# Patient Record
Sex: Male | Born: 1950 | Race: White | Hispanic: No | Marital: Married | Smoking: Never smoker
Health system: Southern US, Community
[De-identification: ages and names within clinical notes are randomized; demographics above are authoritative.]

## PROBLEM LIST (undated history)

## (undated) DIAGNOSIS — C801 Malignant (primary) neoplasm, unspecified: Secondary | ICD-10-CM

## (undated) DIAGNOSIS — K635 Polyp of colon: Secondary | ICD-10-CM

## (undated) DIAGNOSIS — R55 Syncope and collapse: Secondary | ICD-10-CM

## (undated) DIAGNOSIS — H269 Unspecified cataract: Secondary | ICD-10-CM

## (undated) DIAGNOSIS — E785 Hyperlipidemia, unspecified: Secondary | ICD-10-CM

## (undated) DIAGNOSIS — I10 Essential (primary) hypertension: Secondary | ICD-10-CM

## (undated) DIAGNOSIS — N400 Enlarged prostate without lower urinary tract symptoms: Secondary | ICD-10-CM

## (undated) HISTORY — DX: Essential (primary) hypertension: I10

## (undated) HISTORY — DX: Malignant (primary) neoplasm, unspecified: C80.1

## (undated) HISTORY — DX: Syncope and collapse: R55

## (undated) HISTORY — DX: Benign prostatic hyperplasia without lower urinary tract symptoms: N40.0

## (undated) HISTORY — DX: Gilbert syndrome: E80.4

## (undated) HISTORY — PX: VASECTOMY: SHX75

## (undated) HISTORY — DX: Hyperlipidemia, unspecified: E78.5

## (undated) HISTORY — DX: Unspecified cataract: H26.9

## (undated) HISTORY — DX: Polyp of colon: K63.5

---

## 2001-11-21 ENCOUNTER — Ambulatory Visit (HOSPITAL_COMMUNITY): Admission: RE | Admit: 2001-11-21 | Discharge: 2001-11-21 | Payer: Self-pay | Admitting: Gastroenterology

## 2001-11-21 ENCOUNTER — Encounter (INDEPENDENT_AMBULATORY_CARE_PROVIDER_SITE_OTHER): Payer: Self-pay | Admitting: Specialist

## 2005-01-24 ENCOUNTER — Ambulatory Visit: Payer: Self-pay | Admitting: Internal Medicine

## 2006-01-22 HISTORY — PX: COLONOSCOPY W/ POLYPECTOMY: SHX1380

## 2006-03-29 ENCOUNTER — Ambulatory Visit: Payer: Self-pay | Admitting: Internal Medicine

## 2006-03-29 LAB — CONVERTED CEMR LAB
ALT: 18 units/L (ref 0–40)
AST: 24 units/L (ref 0–37)
Albumin: 3.9 g/dL (ref 3.5–5.2)
Alkaline Phosphatase: 35 units/L — ABNORMAL LOW (ref 39–117)
BUN: 10 mg/dL (ref 6–23)
Basophils Absolute: 0.1 10*3/uL (ref 0.0–0.1)
Basophils Relative: 1.4 % — ABNORMAL HIGH (ref 0.0–1.0)
Bilirubin, Direct: 0.2 mg/dL (ref 0.0–0.3)
CO2: 29 meq/L (ref 19–32)
Calcium: 9.5 mg/dL (ref 8.4–10.5)
Chloride: 105 meq/L (ref 96–112)
Cholesterol: 176 mg/dL (ref 0–200)
Creatinine, Ser: 1 mg/dL (ref 0.4–1.5)
Eosinophils Absolute: 0.1 10*3/uL (ref 0.0–0.6)
Eosinophils Relative: 3.3 % (ref 0.0–5.0)
GFR calc Af Amer: 100 mL/min
GFR calc non Af Amer: 83 mL/min
Glucose, Bld: 80 mg/dL (ref 70–99)
HCT: 41.3 % (ref 39.0–52.0)
HDL: 61.2 mg/dL (ref >39.0)
Hemoglobin: 14.5 g/dL (ref 13.0–17.0)
LDL Cholesterol: 105 mg/dL — ABNORMAL HIGH (ref 0–99)
Lymphocytes Relative: 29.1 % (ref 12.0–46.0)
MCHC: 35.2 g/dL (ref 30.0–36.0)
MCV: 89.4 fL (ref 78.0–100.0)
Monocytes Absolute: 0.2 10*3/uL (ref 0.2–0.7)
Monocytes Relative: 5.1 % (ref 3.0–11.0)
Neutro Abs: 2.8 10*3/uL (ref 1.4–7.7)
Neutrophils Relative %: 61.1 % (ref 43.0–77.0)
PSA: 0.78 ng/mL (ref 0.10–4.00)
Platelets: 230 10*3/uL (ref 150–400)
Potassium: 4.1 meq/L (ref 3.5–5.1)
RBC: 4.62 M/uL (ref 4.22–5.81)
RDW: 12.2 % (ref 11.5–14.6)
Sodium: 141 meq/L (ref 135–145)
TSH: 2.16 microintl units/mL (ref 0.35–5.50)
Total Bilirubin: 1 mg/dL (ref 0.3–1.2)
Total CHOL/HDL Ratio: 2.9
Total Protein: 6.4 g/dL (ref 6.0–8.3)
Triglycerides: 50 mg/dL (ref 0–149)
VLDL: 10 mg/dL (ref 0–40)
WBC: 4.5 10*3/uL (ref 4.5–10.5)

## 2006-04-25 ENCOUNTER — Ambulatory Visit: Payer: Self-pay | Admitting: Internal Medicine

## 2006-05-09 ENCOUNTER — Encounter (INDEPENDENT_AMBULATORY_CARE_PROVIDER_SITE_OTHER): Payer: Self-pay | Admitting: Specialist

## 2006-05-09 ENCOUNTER — Ambulatory Visit: Payer: Self-pay | Admitting: Internal Medicine

## 2007-01-28 ENCOUNTER — Ambulatory Visit: Payer: Self-pay | Admitting: Internal Medicine

## 2007-05-09 ENCOUNTER — Ambulatory Visit: Payer: Self-pay | Admitting: Internal Medicine

## 2007-05-20 ENCOUNTER — Telehealth (INDEPENDENT_AMBULATORY_CARE_PROVIDER_SITE_OTHER): Payer: Self-pay | Admitting: *Deleted

## 2008-10-26 ENCOUNTER — Ambulatory Visit: Payer: Self-pay | Admitting: Internal Medicine

## 2008-10-26 DIAGNOSIS — Z860100 Personal history of colon polyps, unspecified: Secondary | ICD-10-CM | POA: Insufficient documentation

## 2008-10-26 DIAGNOSIS — H9319 Tinnitus, unspecified ear: Secondary | ICD-10-CM | POA: Insufficient documentation

## 2008-10-26 DIAGNOSIS — G252 Other specified forms of tremor: Secondary | ICD-10-CM | POA: Insufficient documentation

## 2008-10-27 ENCOUNTER — Encounter (INDEPENDENT_AMBULATORY_CARE_PROVIDER_SITE_OTHER): Payer: Self-pay | Admitting: *Deleted

## 2008-10-29 ENCOUNTER — Encounter (INDEPENDENT_AMBULATORY_CARE_PROVIDER_SITE_OTHER): Payer: Self-pay | Admitting: *Deleted

## 2008-11-29 ENCOUNTER — Ambulatory Visit: Payer: Self-pay | Admitting: Internal Medicine

## 2008-12-27 ENCOUNTER — Telehealth (INDEPENDENT_AMBULATORY_CARE_PROVIDER_SITE_OTHER): Payer: Self-pay | Admitting: *Deleted

## 2009-03-14 ENCOUNTER — Ambulatory Visit: Payer: Self-pay | Admitting: Internal Medicine

## 2009-06-13 ENCOUNTER — Ambulatory Visit: Payer: Self-pay | Admitting: Internal Medicine

## 2009-06-13 DIAGNOSIS — M2550 Pain in unspecified joint: Secondary | ICD-10-CM | POA: Insufficient documentation

## 2009-06-13 DIAGNOSIS — Z91013 Allergy to seafood: Secondary | ICD-10-CM | POA: Insufficient documentation

## 2009-06-14 ENCOUNTER — Telehealth (INDEPENDENT_AMBULATORY_CARE_PROVIDER_SITE_OTHER): Payer: Self-pay | Admitting: *Deleted

## 2009-06-14 LAB — CONVERTED CEMR LAB
Basophils Relative: 0.7 % (ref 0.0–3.0)
Eosinophils Relative: 2.2 % (ref 0.0–5.0)
Lymphs Abs: 1 10*3/uL (ref 0.7–4.0)
MCHC: 34.7 g/dL (ref 30.0–36.0)
Monocytes Absolute: 0.7 10*3/uL (ref 0.1–1.0)
Monocytes Relative: 16 % — ABNORMAL HIGH (ref 3.0–12.0)
Neutro Abs: 2.3 10*3/uL (ref 1.4–7.7)

## 2009-06-15 ENCOUNTER — Encounter: Payer: Self-pay | Admitting: Internal Medicine

## 2009-07-07 ENCOUNTER — Ambulatory Visit: Payer: Self-pay | Admitting: Internal Medicine

## 2009-07-15 LAB — CONVERTED CEMR LAB: IgE (Immunoglobulin E), Serum: 322.9 intl units/mL — ABNORMAL HIGH (ref 0.0–180.0)

## 2009-08-08 ENCOUNTER — Ambulatory Visit: Payer: Self-pay | Admitting: Internal Medicine

## 2009-12-07 ENCOUNTER — Telehealth: Payer: Self-pay | Admitting: Internal Medicine

## 2010-01-31 ENCOUNTER — Other Ambulatory Visit: Payer: Self-pay | Admitting: Internal Medicine

## 2010-01-31 ENCOUNTER — Encounter: Payer: Self-pay | Admitting: Internal Medicine

## 2010-01-31 ENCOUNTER — Ambulatory Visit
Admission: RE | Admit: 2010-01-31 | Discharge: 2010-01-31 | Payer: Self-pay | Source: Home / Self Care | Attending: Internal Medicine | Admitting: Internal Medicine

## 2010-01-31 DIAGNOSIS — I10 Essential (primary) hypertension: Secondary | ICD-10-CM | POA: Insufficient documentation

## 2010-01-31 DIAGNOSIS — E785 Hyperlipidemia, unspecified: Secondary | ICD-10-CM | POA: Insufficient documentation

## 2010-01-31 DIAGNOSIS — Z85828 Personal history of other malignant neoplasm of skin: Secondary | ICD-10-CM | POA: Insufficient documentation

## 2010-01-31 LAB — BASIC METABOLIC PANEL
BUN: 11 mg/dL (ref 6–23)
CO2: 30 mEq/L (ref 19–32)
Calcium: 10 mg/dL (ref 8.4–10.5)
Chloride: 102 mEq/L (ref 96–112)
Creatinine, Ser: 0.9 mg/dL (ref 0.4–1.5)
GFR: 96.94 mL/min (ref >60.00)
Glucose, Bld: 76 mg/dL (ref 70–99)
Potassium: 4.4 mEq/L (ref 3.5–5.1)
Sodium: 140 mEq/L (ref 135–145)

## 2010-01-31 LAB — CBC WITH DIFFERENTIAL/PLATELET
Basophils Absolute: 0 10*3/uL (ref 0.0–0.1)
Basophils Relative: 0.6 % (ref 0.0–3.0)
Eosinophils Absolute: 0.2 10*3/uL (ref 0.0–0.7)
Eosinophils Relative: 3.1 % (ref 0.0–5.0)
HCT: 46.3 % (ref 39.0–52.0)
Hemoglobin: 16.4 g/dL (ref 13.0–17.0)
Lymphocytes Relative: 26.3 % (ref 12.0–46.0)
Lymphs Abs: 1.4 10*3/uL (ref 0.7–4.0)
MCHC: 35.3 g/dL (ref 30.0–36.0)
MCV: 93.8 fl (ref 78.0–100.0)
Monocytes Absolute: 0.5 10*3/uL (ref 0.1–1.0)
Monocytes Relative: 9.9 % (ref 3.0–12.0)
Neutro Abs: 3.2 10*3/uL (ref 1.4–7.7)
Neutrophils Relative %: 60.1 % (ref 43.0–77.0)
Platelets: 252 10*3/uL (ref 150.0–400.0)
RBC: 4.94 Mil/uL (ref 4.22–5.81)
RDW: 12.6 % (ref 11.5–14.6)
WBC: 5.3 10*3/uL (ref 4.5–10.5)

## 2010-01-31 LAB — CONVERTED CEMR LAB: HDL goal, serum: 40 mg/dL

## 2010-01-31 LAB — PSA: PSA: 0.81 ng/mL (ref 0.10–4.00)

## 2010-01-31 LAB — HEPATIC FUNCTION PANEL
ALT: 82 U/L — ABNORMAL HIGH (ref 0–53)
AST: 54 U/L — ABNORMAL HIGH (ref 0–37)
Albumin: 4.5 g/dL (ref 3.5–5.2)
Alkaline Phosphatase: 86 U/L (ref 39–117)
Bilirubin, Direct: 0.2 mg/dL (ref 0.0–0.3)
Total Bilirubin: 1 mg/dL (ref 0.3–1.2)
Total Protein: 7.9 g/dL (ref 6.0–8.3)

## 2010-01-31 LAB — TSH: TSH: 1.88 u[IU]/mL (ref 0.35–5.50)

## 2010-01-31 LAB — LIPID PANEL
Cholesterol: 226 mg/dL — ABNORMAL HIGH (ref 0–200)
HDL: 48.9 mg/dL (ref >39.00)
Total CHOL/HDL Ratio: 5
Triglycerides: 88 mg/dL (ref 0.0–149.0)
VLDL: 17.6 mg/dL (ref 0.0–40.0)

## 2010-01-31 LAB — LDL CHOLESTEROL, DIRECT: Direct LDL: 165.8 mg/dL

## 2010-02-16 ENCOUNTER — Encounter: Payer: Self-pay | Admitting: Internal Medicine

## 2010-02-16 ENCOUNTER — Ambulatory Visit
Admission: RE | Admit: 2010-02-16 | Discharge: 2010-02-16 | Payer: Self-pay | Source: Home / Self Care | Attending: Internal Medicine | Admitting: Internal Medicine

## 2010-02-16 ENCOUNTER — Other Ambulatory Visit: Payer: Self-pay | Admitting: Internal Medicine

## 2010-02-16 DIAGNOSIS — G479 Sleep disorder, unspecified: Secondary | ICD-10-CM | POA: Insufficient documentation

## 2010-02-16 DIAGNOSIS — R413 Other amnesia: Secondary | ICD-10-CM | POA: Insufficient documentation

## 2010-02-16 LAB — VITAMIN B12: Vitamin B-12: 323 pg/mL (ref 211–911)

## 2010-02-17 ENCOUNTER — Encounter: Payer: Self-pay | Admitting: Internal Medicine

## 2010-02-17 LAB — CONVERTED CEMR LAB

## 2010-02-19 LAB — CONVERTED CEMR LAB
ALT: 24 units/L (ref 0–53)
AST: 27 units/L (ref 0–37)
Albumin: 4.4 g/dL (ref 3.5–5.2)
Alkaline Phosphatase: 44 units/L (ref 39–117)
BUN: 9 mg/dL (ref 6–23)
Basophils Absolute: 0 10*3/uL (ref 0.0–0.1)
Basophils Relative: 1 % (ref 0.0–3.0)
Bilirubin, Direct: 0.2 mg/dL (ref 0.0–0.3)
CO2: 29 meq/L (ref 19–32)
Calcium: 9.5 mg/dL (ref 8.4–10.5)
Chloride: 105 meq/L (ref 96–112)
Cholesterol: 208 mg/dL — ABNORMAL HIGH (ref 0–200)
Creatinine, Ser: 0.9 mg/dL (ref 0.4–1.5)
Direct LDL: 118.8 mg/dL
Eosinophils Absolute: 0.1 10*3/uL (ref 0.0–0.7)
Eosinophils Relative: 2.5 % (ref 0.0–5.0)
GFR calc non Af Amer: 92.4 mL/min (ref >60)
Glucose, Bld: 101 mg/dL — ABNORMAL HIGH (ref 70–99)
HCT: 45.2 % (ref 39.0–52.0)
HDL: 72.1 mg/dL (ref >39.00)
Hemoglobin: 15.8 g/dL (ref 13.0–17.0)
Hgb A1c MFr Bld: 5.5 % (ref 4.6–6.5)
Lymphocytes Relative: 29.2 % (ref 12.0–46.0)
Lymphs Abs: 1.1 10*3/uL (ref 0.7–4.0)
MCHC: 34.9 g/dL (ref 30.0–36.0)
MCV: 92.8 fL (ref 78.0–100.0)
Monocytes Absolute: 0.3 10*3/uL (ref 0.1–1.0)
Monocytes Relative: 9.1 % (ref 3.0–12.0)
Neutro Abs: 2.2 10*3/uL (ref 1.4–7.7)
Neutrophils Relative %: 58.2 % (ref 43.0–77.0)
PSA: 1.52 ng/mL (ref 0.10–4.00)
Platelets: 211 10*3/uL (ref 150.0–400.0)
Potassium: 4.6 meq/L (ref 3.5–5.1)
RBC: 4.86 M/uL (ref 4.22–5.81)
RDW: 12.1 % (ref 11.5–14.6)
Sodium: 140 meq/L (ref 135–145)
TSH: 2.32 microintl units/mL (ref 0.35–5.50)
Total Bilirubin: 1.2 mg/dL (ref 0.3–1.2)
Total CHOL/HDL Ratio: 3
Total Protein: 7.7 g/dL (ref 6.0–8.3)
Triglycerides: 52 mg/dL (ref 0.0–149.0)
VLDL: 10.4 mg/dL (ref 0.0–40.0)
WBC: 3.7 10*3/uL — ABNORMAL LOW (ref 4.5–10.5)

## 2010-02-21 NOTE — Progress Notes (Signed)
Summary: Prior Auth APPROVED SINGULAIR  Phone Note Refill Request Call back at [phone] Message from:  Pharmacy on Jun 14, 2009 9:07 AM  Refills Requested: Medication #1:  SINGULAIR 10MG  TABLETS, take 1 tablet by mouth daily as needed   Dosage confirmed as above?Dosage Confirmed   Supply Requested: 1 month   Last Refilled: 06/13/2009 Prior Auth [phone] Id#: [id number]  Next Appointment Scheduled: none Initial call taken by: Harold Barban,  Jun 14, 2009 9:08 AM  Follow-up for Phone Call        prior auth APPROVED 06-14-09-03-09-12, PHARMACY FAXED, APPROVAL LETTER SCAN TO CHART.................Marland KitchenFelecia Deloach CMA  Jun 15, 2009 10:07 AM

## 2010-02-21 NOTE — Progress Notes (Signed)
Summary: med refill  Phone Note Refill Request Call back at [phone] Message from:  Patient on December 07, 2009 1:28 PM  Refills Requested: Medication #1:  PROPRANOLOL HCL 20 MG TABS 1 by mouth two times a day if B/P averages 135/85. Patient is already aware he has cpx coming up.  Initial call taken by: Lucious Groves CMA,  December 07, 2009 1:28 PM    Prescriptions: PROPRANOLOL HCL 20 MG TABS (PROPRANOLOL HCL) 1 by mouth two times a day if B/P averages 135/85  #60 x 3   Entered by:   Lucious Groves CMA   Authorized by:   Marga Melnick MD   Signed by:   Lucious Groves CMA on 12/07/2009   Method used:   Electronically to        [id number]* (retail)       [address]       [place], [place]  [address]       Ph: [phone]       Fax: [phone]   RxID:   [id number]

## 2010-02-21 NOTE — Assessment & Plan Note (Signed)
Summary: Flu?/kdc   Vital Signs:  Patient profile:   60 year old male Weight:      187.0 pounds Temp:     98.6 degrees F oral Pulse rate:   64 / minute Resp:     14 per minute BP sitting:   122 / 80  (left arm) Cuff size:   large  Vitals Entered By: Shonna Chock (March 14, 2009 4:47 PM) CC: Head/Chest congestion, swollen glands on left side,cold sore, chills (off/on), & cough x 8-9 days. Patient also with stomach discomfort and nausea (only yesterday) Comments REVIEWED MED LIST, PATIENT AGREED DOSE AND INSTRUCTION CORRECT    CC:  Head/Chest congestion, swollen glands on left side, cold sore, chills (off/on), and & cough x 8-9 days. Patient also with stomach discomfort and nausea (only yesterday).  History of Present Illness: Onset 03/06/2009 as PNDrainage, ST, NP cough & head congestion  which lasted until 02/17 . On 02/20 he awoke with a "knot" in stomach with alternating chills & sweats  &  nausea all day.Congestion has  returned as of today. Fever blister 02/19. Wife had GI bug last week. Flu shot in 10/2008.  Allergies (verified): No Known Drug Allergies  Review of Systems General:  Complains of chills and sweats; denies fever. ENT:  Complains of nasal congestion; denies sinus pressure; No frontal headache, facial pain or purulence. Resp:  Complains of cough; denies shortness of breath, sputum productive, and wheezing. GI:  Denies abdominal pain, diarrhea, and indigestion. GU:  Denies discharge, dysuria, and hematuria.  Physical Exam  General:  in no acute distress; alert,appropriate and cooperative throughout examination Ears:  External ear exam shows no significant lesions or deformities.  Otoscopic examination reveals clear canals, tympanic membranes are intact bilaterally without bulging, retraction, inflammation or discharge. Hearing is grossly normal bilaterally. Nose:  External nasal examination shows no deformity or inflammation. Nasal mucosa are pink and moist  without lesions or exudates. Mouth:  Fever blister L lower lip. Teeth in good repair. Lungs:  Normal respiratory effort, chest expands symmetrically. Lungs are clear to auscultation, no crackles or wheezes. Heart:  regular rhythm and bradycardia. S4  Abdomen:  Bowel sounds positive,abdomen soft and non-tender without masses, organomegaly or hernias noted. Cervical Nodes:  No lymphadenopathy noted Axillary Nodes:  No palpable lymphadenopathy   Impression & Recommendations:  Problem # 1:  URI (ICD-465.9) "Double Sickening"  Problem # 2:  FEVER (ICD-780.60) with chills  Problem # 3:  HERPES LABIALIS (ICD-054.9)  Complete Medication List: 1)  Propranolol Hcl 20 Mg Tabs (Propranolol hcl) .Marland Kitchen.. 1 by mouth two times a day if b/p averages 135/85 2)  Doxycycline Hyclate 100 Mg Caps (Doxycycline hyclate) .Marland Kitchen.. 1 two times a day x 5 days then 1 once daily 3)  Zovirax 5 % Crea (Acyclovir)  Patient Instructions: 1)  Report any localizing signs as discussed. 2)  Drink as much fluid as you can tolerate for the next few days. 3)  Take 650-1000mg  of Tylenol every 4-6 hours as needed for relief of pain or comfort of fever AVOID taking more than 4000mg   in a 24 hour period (can cause liver damage in higher doses) OT take 400-600mg  of Ibuprofen (Advil, Motrin) with food every 4-6 hours as needed for relief of pain or comfort of fever. Prescriptions: ZOVIRAX 5 % CREA (ACYCLOVIR)   #2 samples x 0   Entered and Authorized by:   Marga Melnick MD   Signed by:   Marga Melnick MD on 03/14/2009  Method used:   Samples Given   RxID:   [id number] DOXYCYCLINE HYCLATE 100 MG CAPS (DOXYCYCLINE HYCLATE) 1 two times a day X 5 days then 1 once daily  #15 x 0   Entered and Authorized by:   Marga Melnick MD   Signed by:   Marga Melnick MD on 03/14/2009   Method used:   Faxed to ...       [facility] #[id number]* (retail)       [address]       [place],  [place]  [address]       Ph: [phone]       Fax: [phone]   RxID:   [id number]

## 2010-02-21 NOTE — Medication Information (Signed)
Summary: Prior Authorization & Approval for Singulair/[employer]  Prior Authorization & Approval for Singulair/[employer]   Imported By: Lanelle Bal 06/24/2009 10:25:20  _____________________________________________________________________  External Attachment:    Type:   Image     Comment:   External Document

## 2010-02-21 NOTE — Assessment & Plan Note (Signed)
Summary: flu-like symptoms//lch   Vital Signs:  Patient profile:   60 year old male Weight:      188 pounds BMI:     27.86 Temp:     98.6 degrees F oral Pulse rate:   76 / minute Resp:     14 per minute BP sitting:   136 / 94  (left arm) Cuff size:   large  Vitals Entered By: Shonna Chock (Jun 13, 2009 12:06 PM) CC: Low-grade fever, body aches,chills. x 2 weeks head/congestion congestion, cough and runny nose Comments REVIEWED MED LIST, PATIENT AGREED DOSE AND INSTRUCTION CORRECT    CC:  Low-grade fever, body aches, chills. x 2 weeks head/congestion congestion, and cough and runny nose.  History of Present Illness: Onset 2 weeks ago as ST & head/chest congestion upon awakening with rhinitis & NP cough. Since symptoms have resolved except for NP cough. On 05/22 he woke with F, joint aches & chills. Rx: Tylenol; Neti pot. This has been a recurring picture  every 2 months since Fall 2010. Allergy testing is scheduled 07/07/2009 with Dr Fannie Knee. Shellfish ingestion  has caused abd pain with N&V ; tremors & lightheadedness ; & facial swelling on a 3rd  occasion.  Allergies (verified): No Known Drug Allergies  Review of Systems General:  Complains of sweats; Temp to almost  100 . ENT:  Complains of postnasal drainage; denies nasal congestion and sinus pressure; No frontal headaches, facial pain or purulence. Resp:  Complains of wheezing; denies chest pain with inspiration, shortness of breath, and sputum productive. GI:  Denies indigestion. Derm:  Denies lesion(s) and rash; No tick exposure. Allergy:  Denies hives or rash, itching eyes, and sneezing.  Physical Exam  General:  well-nourished,in no acute distress; alert,appropriate and cooperative throughout examination Eyes:  No corneal or conjunctival inflammation noted.No icterus.Perrla. Ears:  L ear normal.  Some wax on R Nose:  External nasal examination shows no deformity or inflammation. Nasal mucosa are pink and moist  without lesions or exudates. Mouth:  Oral mucosa and oropharynx without lesions or exudates.  Teeth in good repair. Lungs:  Normal respiratory effort, chest expands symmetrically. Lungs are clear to auscultation, no crackles or wheezes. Heart:  Normal rate and regular rhythm. S1 and S2 normal without gallop, murmur, click, rub .S4 Skin:  Intact without suspicious lesions or rashes Cervical Nodes:  No lymphadenopathy noted Axillary Nodes:  No palpable lymphadenopathy   Impression & Recommendations:  Problem # 1:  COUGH (ICD-786.2)  Orders: Venipuncture (16109) TLB-CBC Platelet - w/Differential (85025-CBCD)  Problem # 2:  FEVER (ICD-780.60)  Orders: Venipuncture (60454) TLB-CBC Platelet - w/Differential (85025-CBCD)  Problem # 3:  ARTHRALGIA (ICD-719.40)  Orders: Venipuncture (09811) TLB-CBC Platelet - w/Differential (85025-CBCD)  Problem # 4:  PERSONAL HISTORY OF ALLERGY TO SEAFOOD (ICD-V15.04) 3 separate episodes, the 3rd with possible angioedema  Complete Medication List: 1)  Propranolol Hcl 20 Mg Tabs (Propranolol hcl) .Marland Kitchen.. 1 by mouth two times a day if b/p averages 135/85 2)  Azithromycin 250 Mg Tabs (Azithromycin) .... As per pack 3)  Singulair 10 Mg Tabs (Montelukast sodium) .Marland Kitchen.. 1 once daily  Patient Instructions: 1)  Avoid shellfish completely as potential for serious reaction exists. Prescriptions: SINGULAIR 10 MG TABS (MONTELUKAST SODIUM) 1 once daily  #30 x 5   Entered by:   Jeremy Johann CMA   Authorized by:   Marga Melnick MD   Signed by:   Marga Melnick MD on 06/17/2009   Method used:   Samples  Given   RxID:   [id number] AZITHROMYCIN 250 MG TABS (AZITHROMYCIN) as per pack  #1 x 0   Entered and Authorized by:   Marga Melnick MD   Signed by:   Marga Melnick MD on 06/13/2009   Method used:   Faxed to ...       [id number]* (retail)       [address]       [place], [place]  [address]        Ph: [phone]       Fax: [phone]   RxID:   [id number]

## 2010-02-21 NOTE — Assessment & Plan Note (Signed)
Summary: ALLERGIES SELF REF/KLW   Vital Signs:  Patient profile:   60 year old male Height:      69 inches Weight:      189 pounds BMI:     28.01 O2 Sat:      99 % on Room air Pulse rate:   73 / minute BP sitting:   142 / 90  (left arm) Cuff size:   regular  Vitals Entered By: Reynaldo Minium CMA (July 07, 2009 1:40 PM)  O2 Flow:  Room air  Primary Provider/Referring Provider:  Dr Alwyn Ren  CC:  Allergy new pt-? shellfish allergy.  History of Present Illness: July 07, 2009- 58 yoM referred by Dr Alwyn Ren, concerned about allergy to shellfish. Over the past 5 years he has had 2-3 episodes when he had problems after eating oysters or clams. W/in 1 hour he had nausea, diarrhea. Most recently 2 months ago he had "low country boil' with friends. Next day he had malaise and puffy face. In the past year he has had several colds every couple of months, often after a sick exposure Left him with lingering nonproductive cough.  He has no problems with fish or shrimp- only with oysters, mussels and clams (filter feeders).  Denies seasonal allergy, other food intolerance, problems with latex, contrast or aspirin. Denies wheeze, throat tightness, fever, glands or joint pains.  Preventive Screening-Counseling & Management  Alcohol-Tobacco     Smoking Status: never  Current Medications (verified): 1)  Propranolol Hcl 20 Mg Tabs (Propranolol Hcl) .Marland Kitchen.. 1 By Mouth Two Times A Day If B/p Averages 135/85  Allergies (verified): No Known Drug Allergies  Past History:  Past Medical History: Last updated: 10/26/2008 No PMH of HTN  Colonic polyps, hx of  Past Surgical History: Last updated: 10/26/2008 Colon polypectomy 2008(neg @ 51 & 46)  Family History: Last updated: 10/26/2008 Father: lung CA (smoker) Mother: Alsheimers, colon CA Siblings: neg; ALL 4 GP had CVAs & HTN; P uncle CAD,CABG; 2 M aunts Alsheimers  Social History: Last updated: 07/07/2009 Occupation:[employer] Married, children Never Smoked Alcohol use-yes: socially Regular exercise-yes: weights 3X/ week; run 3X/ week for 30-45 min  Risk Factors: Exercise: yes (10/26/2008)  Risk Factors: Smoking Status: never (07/07/2009)  Social History: Occupation:[employer] Married, children Never Smoked Alcohol use-yes: socially Regular exercise-yes: weights 3X/ week; run 3X/ week for 30-45 min  Review of Systems      See HPI       The patient complains of non-productive cough, acid heartburn, weight change, and sore throat.  The patient denies shortness of breath with activity, shortness of breath at rest, productive cough, coughing up blood, chest pain, irregular heartbeats, indigestion, loss of appetite, abdominal pain, difficulty swallowing, tooth/dental problems, headaches, nasal congestion/difficulty breathing through nose, sneezing, itching, ear ache, anxiety, depression, hand/feet swelling, joint stiffness or pain, rash, change in color of mucus, and fever.    Physical Exam  Additional Exam:  General: A/Ox3; pleasant and cooperative, NAD, fir appearing SKIN: no rash, lesions NODES: no lymphadenopathy HEENT: [place]/AT, EOM- WNL, Conjuctivae- clear, PERRLA, TM-WNL, Nose- clear, Throat- clear and wnl, Mallampati  II NECK: Supple w/ fair ROM, JVD- none, normal carotid impulses w/o bruits Thyroid- normal to palpation CHEST: Clear to P&A, no cough while here HEART: RRR, no m/g/r heard ABDOMEN: Soft and nl; nml bowel sounds; no organomegaly or masses noted ZOX:WRUE, nl pulses, no edema  NEURO: Grossly intact to observation      Impression & Recommendations:  Problem #  1:  ? 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 These were from different sources and different cooking styles. We discussed infectious or toxin related gastroenteritis,  food  allergy vs intolerance, question of additives etc in the meal, like MSG. He has not had hives or wheezing. This may not be related to a recurrent respiratory illness pattern he suffered last winter.  We can seek evidence of immune system involvement. He understands to avoid those foods he associates with problems.  Other Orders: Consultation Level III [phone] T-"RAST" (Allergy Full Profile) IGE ([id number]) T- * Misc. Laboratory test [phone])  Patient Instructions: 1)  Please schedule a follow-up appointment in 1 month. 2)  Lab 3)  Avoid foods that are known problems 4)  Consider pre-treating with an antihistamine like Claritin or Allegra an hour before eating where there is risk of exposure to problem foods.

## 2010-02-21 NOTE — Assessment & Plan Note (Signed)
Summary: 1 month/apc   Primary Provider/Referring Provider:  Dr Alwyn Ren  CC:  1 month follow up visit-review allergy labs.  History of Present Illness: July 07, 2009- 58 yoM referred by Dr Alwyn Ren, concerned about allergy to shellfish. Over the past 5 years he has had 2-3 episodes when he had problems after eating oysters or clams. W/in 1 hour he had nausea, diarrhea. Most recently 2 months ago he had "low country boil' with friends. Next day he had malaise and puffy face. In the past year he has had several colds every couple of months, often after a sick exposure Left him with lingering nonproductive cough.  He has no problems with fish or shrimp- only with oysters, mussels and clams (filter feeders).  Denies seasonal allergy, other food intolerance, problems with latex, contrast or aspirin. Denies wheeze, throat tightness, fever, glands or joint pains.  August 08, 2009- ? Seafood allergy, recurrent bronchitis No new episodes or acute events since last there. He avoids suspect foods. He is outdoors a lot without recognizing problems attributable to grass or inhalants. He has not had another bronchitis episode since last here..  We discussed the food profiles and the available reponse. He can keep a food diary. Total IgE 301.6 with specific elevations for dust mite, grass, a mold, shrimp, crab, crayfish and tomato. He didn't show elevations to those foods he had implicated- clam, oyster, mussels.    Preventive Screening-Counseling & Management  Alcohol-Tobacco     Smoking Status: never  Current Medications (verified): 1)  Propranolol Hcl 20 Mg Tabs (Propranolol Hcl) .Marland Kitchen.. 1 By Mouth Two Times A Day If B/p Averages 135/85  Allergies (verified): No Known Drug Allergies  Past History:  Past Surgical History: Last updated: 10/26/2008 Colon polypectomy 2008(neg @ 51 & 46)  Family History: Last updated: 10/26/2008 Father: lung CA (smoker) Mother: Alsheimers, colon CA Siblings: neg; ALL  4 GP had CVAs & HTN; P uncle CAD,CABG; 2 M aunts Alsheimers  Social History: Last updated: 07/07/2009 Occupation:[employer] Married, children Never Smoked Alcohol use-yes: socially Regular exercise-yes: weights 3X/ week; run 3X/ week for 30-45 min  Risk Factors: Exercise: yes (10/26/2008)  Risk Factors: Smoking Status: never (08/08/2009)  Past Medical History: No PMH of HTN  Colonic polyps, hx of Question of food allergy  Review of Systems      See HPI  The patient denies anorexia, fever, weight loss, weight gain, vision loss, decreased hearing, hoarseness, chest pain, syncope, dyspnea on exertion, peripheral edema, prolonged cough, headaches, hemoptysis, abdominal pain, melena, hematochezia, and severe indigestion/heartburn.    Vital Signs:  Patient profile:   60 year old male Height:      69 inches Weight:      190.38 pounds BMI:     28.22 O2 Sat:      96 % on Room air Pulse rate:   70 / minute BP sitting:   142 / 98  (right arm) Cuff size:   regular  Vitals Entered By: Reynaldo Minium CMA (August 08, 2009 2:23 PM)  O2 Flow:  Room air CC: 1 month follow up visit-review allergy labs   Physical Exam  Additional Exam:  General: A/Ox3; pleasant and cooperative, NAD, fir appearing SKIN: no rash, lesions NODES: no lymphadenopathy HEENT: [place]/AT, EOM- WNL, Conjuctivae- clear, PERRLA, TM-WNL, Nose- clear, Throat- clear and wnl, Mallampati  II NECK: Supple w/ fair ROM, JVD- none, normal carotid impulses w/o bruits Thyroid- normal to palpation CHEST: Clear to P&A, no cough while here HEART: RRR,  no m/g/r heard ABDOMEN: Soft and nl; nml bowel sounds; no organomegaly or masses noted JWJ:XBJY, nl pulses, no edema  NEURO: Grossly intact to observation      Impression & Recommendations:  Problem # 1:  ? 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  I have reviewed these results and suggested when appropriate, that he try pretreating with an anithistamine an hour before he eats where he can't be sure about the food content. He will try keeping a food diary. He has not noted symptoms from inhalants like dust or grass.  Other Orders: Est. Patient Level III (78295)  Patient Instructions: 1)  Please schedule a follow-up appointment as needed. 2)  Consider keeping a food diary for a month or so. Record what you ate and when you have symptoms that seem related. 3)  You had blood tests indicating increased circulating IgE allergy levels against: 4)  Shrimp 5)  Crab 6)  Crayfish 7)  Dust mite 8)  Grass pollen 9)  One mold (alternaria) 10)  tomato 11)  If any of these semms to correspond to symptoms, then don't eat that food.

## 2010-02-23 NOTE — Assessment & Plan Note (Signed)
Summary: patient and spouse need to talk about "personal matter"///sph   Vital Signs:  Patient profile:   60 year old male Weight:      189.6 pounds BMI:     27.30 Pulse rate:   72 / minute Resp:     12 per minute BP sitting:   142 / 96  (left arm) Cuff size:   large  Vitals Entered By: Shonna Chock CMA (February 16, 2010 11:02 AM) CC: Personal , Fatigue, Insomnia   Primary Care Provider:  Dr Alwyn Ren  CC:  Personal , Fatigue, and Insomnia.  History of Present Illness:    His family has concerns about memory , concentration & mood changes ; these symptoms appear to be exacerbated by alcohol. He  has noticed some mood changes & " mild recession" rather than "depression". The patient reports  intermittent  fatigue, both  motivational fatigue &  physical fatigue components .  The patient denies fever, night sweats, weight loss ( weight up 20# over 2 years), exertional chest pain, dyspnea, cough, and hemoptysis.  Other symptoms include severe snoring and skin changes (dry).  The patient denies the following symptoms: leg swelling, orthopnea, PND, melena, adenopathy, and daytime sleepiness.   Poor sleep has been a chronic .  The patient denies altered appetite.      The patient reports frequent awakening, but denies difficulty falling asleep, nightmares, leg movements, and apnea noted by partner.  Insomnia has led to problems with impaired judgement & issues described above.  Risk factors for insomnia include caffeine use, 3 cups / day.  Behaviors that may contribute to insomnia include watching TV in bed and alcohol use.    Current Medications (verified): 1)  Propranolol Hcl 20 Mg Tabs (Propranolol Hcl) .Marland Kitchen.. 1 By Mouth Two Times A Day If B/p Averages 135/85 2)  Amlodipine Besylate 5 Mg Tabs (Amlodipine Besylate) .Marland Kitchen.. 1 Once Daily 3)  Fluticasone Propionate 50 Mcg/act Susp (Fluticasone Propionate) .Marland Kitchen.. 1 Spray Two Times A Day As Needed  Allergies: No Known Drug Allergies  Family  History: Father: lung cancer (smoker) Mother: Alsheimers, colon  cancer  Siblings: negative; ALL 4 GP had CVAs & HTN; P uncle: CAD,CABG; 2 M aunts :Alsheimers;1 M aunt : Schizophrenia; 2 M uncles : Alsheimer's; no FH alcoholism, drug abuse, Bipolar Disorder  Review of Systems Neuro:  Denies brief paralysis, disturbances in coordination, numbness, poor balance, and tingling. Psych:  Complains of easily angered, easily tearful, and irritability; denies panic attacks.  Physical Exam  General:  well-nourished,in no acute distress; alert,appropriate and cooperative throughout examination Eyes:  No corneal or conjunctival inflammation noted. EOMI. Perrla. Field of  Vision grossly normal. Mouth:  No tongue deviation Neck:  No deformities, masses, or tenderness noted. Heart:  Normal rate and regular rhythm. S1 and S2 normal without gallop, murmur, click, rub. Pulses:  R and L carotid pulses are full and equal bilaterally; no bruits Neurologic:  cranial nerves II-XII intact, strength normal in all extremities, gait normal, DTRs symmetrical and normal, finger-to-nose normal, and Romberg negative.  RAM & alliterative speech  WNL Fine intentional  tremor of hands  Clock Test 30 of 30; Clock Drawing  Test good. ten animals named in 10 seconds.  Skin:  Intact without suspicious lesions or rashes Cervical Nodes:  No lymphadenopathy noted Axillary Nodes:  No palpable lymphadenopathy Psych:  memory intact for recent and remote, flat affect, and subdued.     Impression & Recommendations:  Problem # 1:  MEMORY LOSS (ICD-780.93)  Orders: Venipuncture (16109) TLB-B12, Serum-Total ONLY (60454-U98) T-RPR (Syphilis) (11914-78295) Specimen Handling (62130) Neurology Referral (Neuro)  Problem # 2:  SLEEP DISORDER (ICD-780.50)  Orders: Neurology Referral (Neuro)  Problem # 3:  INTENTION TREMOR (ICD-333.1)  Orders: Neurology Referral (Neuro)  Complete Medication List: 1)  Propranolol Hcl 20 Mg Tabs  (Propranolol hcl) .Marland Kitchen.. 1 by mouth two times a day if b/p averages 135/85 2)  Amlodipine Besylate 5 Mg Tabs (Amlodipine besylate) .Marland Kitchen.. 1 once daily 3)  Fluticasone Propionate 50 Mcg/act Susp (Fluticasone propionate) .Marland Kitchen.. 1 spray two times a day as needed  Patient Instructions: 1)  Keep a diary of alcohol consumption.   Orders Added: 1)  Est. Patient Level IV [86578] 2)  Venipuncture [36415] 3)  TLB-B12, Serum-Total ONLY [82607-B12] 4)  T-RPR (Syphilis) [46962-95284] 5)  Specimen Handling [99000] 6)  Neurology Referral [Neuro]  Appended Document: patient and spouse need to talk about "personal matter"///sph Note: OV encompassed 30 min; > 50 % involved history taking from him & spouse & discussions of evaluation

## 2010-02-23 NOTE — Letter (Signed)
Summary: Cancer Screening/[facility] Personalized Risk Profile  Cancer Screening/[facility] Personalized Risk Profile   Imported By: Lanelle Bal 02/08/2010 15:30:41  _____________________________________________________________________  External Attachment:    Type:   Image     Comment:   External Document

## 2010-02-23 NOTE — Assessment & Plan Note (Signed)
Summary: cpx/cbs   Vital Signs:  Patient profile:   60 year old male Height:      70 inches Weight:      190.8 pounds BMI:     27.48 Temp:     98.5 degrees F oral Pulse rate:   62 / minute Resp:     14 per minute BP sitting:   140 / 98  (left arm) Cuff size:   large  Vitals Entered By: Shonna Chock CMA (January 31, 2010 1:06 PM) CC: CPX, recheck on B/P: 140/98, Lipid Management   Primary Care Provider:  Dr Alwyn Ren  CC:  CPX, recheck on B/P: 140/98, and Lipid Management.  History of Present Illness:    [name] is here for aphysical; his BP has been averaging in the 140s/90s.The patient denies lightheadedness, urinary frequency, headaches, edema, and fatigue.  The patient denies the following associated symptoms: chest pain, chest pressure, exercise intolerance, dyspnea, palpitations, and syncope.  Compliance with medications (by patient report) has been near 100%.  The patient reports that dietary compliance has been good.  The patient reports exercising  5 X per week.  Adjunctive measures currently used by the patient include salt restriction. He has been taking Sudafed type agents for rhinitis.    Lipid Management History:      Positive NCEP/ATP III risk factors include male age 21 years old or older and hypertension.  Negative NCEP/ATP III risk factors include non-diabetic, HDL cholesterol greater than 60, no family history for ischemic heart disease, non-tobacco-user status, no ASHD (atherosclerotic heart disease), no prior stroke/TIA, no peripheral vascular disease, and no history of aortic aneurysm.     Current Medications (verified): 1)  Propranolol Hcl 20 Mg Tabs (Propranolol Hcl) .Marland Kitchen.. 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 Fr[place]ham Study LDL goal = < 160. Syncope , Vasovagal etiology 2005; Benign intention Tremor, mild ( Beta blocker effective)  Past Surgical History: Colon polypectomy 2008 (negative  @ 46 & 51), Dr Juanda Chance; Lasik surgery; Vasectomy  Family History: Father: lung cancer (smoker) Mother: [place], colon  cancer  Siblings: negative; ALL 4 GP had CVAs & HTN; P uncle: CAD,CABG; 2 M aunts :[place]ers  Social History: Paediatric nurse Married, children Never Smoked Alcohol use-yes: socially: cyclical , not regularly  Regular exercise-yes  Review of Systems  The patient denies anorexia, fever, vision loss, decreased hearing, hoarseness, prolonged cough, hemoptysis, abdominal pain, melena, hematochezia, severe indigestion/heartburn, hematuria, suspicious skin lesions, depression, unusual weight change, abnormal bleeding, enlarged lymph nodes, and angioedema.         weight over past 12-18 months.up 15 # .  Tinnitus  , ? progressive.  Physical Exam  General:  Thin,well-nourished;alert,appropriate and cooperative throughout examination Head:  Normocephalic and atraumatic without obvious abnormalities.  Eyes:  No corneal or conjunctival inflammation noted. Perrla. Funduscopic exam benign, without hemorrhages, exudates or papilledema.  Ears:  External ear exam shows no significant lesions or deformities.  Otoscopic examination reveals clear canals, tympanic membranes are intact bilaterally without bulging, retraction, inflammation or discharge. Hearing is grossly normal bilaterally, whisper  heard @ 6 ft. Rinne's & Weber's testing normal. Nose:  External nasal examination  shows no deformity or inflammation. Nasal mucosa are pink and moist without lesions or exudates. Mouth:  Oral mucosa and oropharynx without lesions or exudates.  Teeth in good repair. Neck:  No deformities,  masses, or tenderness noted. Lungs:  Normal respiratory effort, chest expands symmetrically. Lungs are clear to auscultation, no crackles or wheezes. Heart:  Normal rate and regular rhythm. S1 and S2 normal without gallop, murmur, click, rub or other extra sounds. Abdomen:  Bowel sounds positive,abdomen soft and non-tender without masses, organomegaly or hernias noted. Rectal:  No external abnormalities noted. Normal sphincter tone. No rectal masses or tenderness. Genitalia:  Testes bilaterally descended without nodularity, tenderness or masses. No scrotal masses or lesions. No penis lesions or urethral discharge. L varicocele.   Prostate:  Prostate gland firm and smooth, no enlargement, nodularity, tenderness, mass, asymmetry or induration. Msk:  No deformity or scoliosis noted of thoracic or lumbar spine.   Pulses:  R and L carotid,radial,dorsalis pedis and posterior tibial pulses are full and equal bilaterally Extremities:  No clubbing, cyanosis, edema, or deformity noted with normal full range of motion of all joints.   Neurologic:  alert & oriented X3 and DTRs symmetrical and normal.     Impression & Recommendations:  Problem # 1:  ROUTINE GENERAL MEDICAL EXAM@HEALTH  CARE FACL (ICD-V70.0)  Orders: EKG w/ Interpretation (93000) Venipuncture (78295) TLB-Lipid Panel (80061-LIPID) TLB-BMP (Basic Metabolic Panel-BMET) (80048-METABOL) TLB-CBC Platelet - w/Differential (85025-CBCD) TLB-Hepatic/Liver Function Pnl (80076-HEPATIC) TLB-TSH (Thyroid Stimulating Hormone) (84443-TSH) TLB-PSA (Prostate Specific Antigen) (84153-PSA) Specimen Handling (62130)  Problem # 2:  HYPERTENSION (ICD-401.9)  His updated medication list for this problem includes:    Propranolol Hcl 20 Mg Tabs (Propranolol hcl) .Marland Kitchen... 1 by mouth two times a day if b/p averages 135/85    Amlodipine Besylate 5 Mg Tabs (Amlodipine besylate) .Marland Kitchen... 1 once daily  Problem # 3:  TINNITUS (ICD-388.30)  Problem # 4:   HYPERLIPIDEMIA (ICD-272.4)  Problem # 5:  INTENTION TREMOR (ICD-333.1)  Complete Medication List: 1)  Propranolol Hcl 20 Mg Tabs (Propranolol hcl) .Marland Kitchen.. 1 by mouth two times a day if b/p averages 135/85 2)  Amlodipine Besylate 5 Mg Tabs (Amlodipine besylate) .Marland Kitchen.. 1 once daily 3)  Fluticasone Propionate 50 Mcg/act Susp (Fluticasone propionate) .Marland Kitchen.. 1 spray two times a day as needed  Other Orders: Tdap => 23yrs IM (86578) Admin 1st Vaccine (46962)  Lipid Assessment/Plan:      Based on NCEP/ATP III, the patient's risk factor category is "0-1 risk factors".  The patient's lipid goals are as follows: Total cholesterol goal is 200; LDL cholesterol goal is 160; HDL cholesterol goal is 40; Triglyceride goal is 150.    Patient Instructions: 1)  Avoid stimulants as discussed. 2)  Check your Blood Pressure regularly.Your BP goal = AVERAGE < 135/85. Prescriptions: FLUTICASONE PROPIONATE 50 MCG/ACT SUSP (FLUTICASONE PROPIONATE) 1 spray two times a day as needed  #1 x 11   Entered and Authorized by:   Marga Melnick MD   Signed by:   Marga Melnick MD on 01/31/2010   Method used:   Print then Give to Patient   RxID:   [id number] AMLODIPINE BESYLATE 5 MG TABS (AMLODIPINE BESYLATE) 1 once daily  #30 x 5   Entered and Authorized by:   Marga Melnick MD   Signed by:   Marga Melnick MD on 01/31/2010   Method used:   Print then Give to Patient   RxID:   [id number] PROPRANOLOL HCL 20 MG TABS (PROPRANOLOL HCL) 1 by mouth two  times a day if B/P averages 135/85  #180 x 3   Entered and Authorized by:   Marga Melnick MD   Signed by:   Marga Melnick MD on 01/31/2010   Method used:   Print then Give to Patient   RxID:   [id number]    Orders Added: 1)  Est. Patient 40-64 years [99396] 2)  EKG w/ Interpretation [93000] 3)  Venipuncture [36415] 4)  TLB-Lipid Panel [80061-LIPID] 5)  TLB-BMP (Basic Metabolic Panel-BMET) [80048-METABOL] 6)  TLB-CBC Platelet - w/Differential  [85025-CBCD] 7)  TLB-Hepatic/Liver Function Pnl [80076-HEPATIC] 8)  TLB-TSH (Thyroid Stimulating Hormone) [84443-TSH] 9)  TLB-PSA (Prostate Specific Antigen) [84153-PSA] 10)  Tdap => 30yrs IM [90715] 11)  Admin 1st Vaccine [90471] 12)  Specimen Handling [99000]   Immunizations Administered:  Tetanus Vaccine:    Vaccine Type: Tdap    Site: right deltoid    Mfr: GlaxoSmithKline    Dose: 0.5 ml    Route: IM    Given by: Shonna Chock CMA    Exp. Date: 11/11/2011    Lot #: [id number]    VIS given: 12/10/07 version given January 31, 2010.   Immunizations Administered:  Tetanus Vaccine:    Vaccine Type: Tdap    Site: right deltoid    Mfr: GlaxoSmithKline    Dose: 0.5 ml    Route: IM    Given by: Shonna Chock CMA    Exp. Date: 11/11/2011    Lot #: [id number]    VIS given: 12/10/07 version given January 31, 2010.

## 2010-03-09 NOTE — Letter (Signed)
Summary: Mini-Mental State Exam  Mini-Mental State Exam   Imported By: Maryln Gottron 02/27/2010 11:20:53  _____________________________________________________________________  External Attachment:    Type:   Image     Comment:   External Document

## 2010-04-03 ENCOUNTER — Encounter: Payer: Self-pay | Admitting: Internal Medicine

## 2010-04-11 NOTE — Letter (Signed)
Summary: Unable to Schedule Patient for Sleep Study/[facility]  Unable to Schedule Patient for Sleep Study/[facility]   Imported By: Maryln Gottron 04/06/2010 09:29:43  _____________________________________________________________________  External Attachment:    Type:   Image     Comment:   External Document

## 2010-06-08 ENCOUNTER — Other Ambulatory Visit: Payer: Self-pay | Admitting: Internal Medicine

## 2010-06-09 NOTE — Assessment & Plan Note (Signed)
[facility]                        [place] OFFICE NOTE   RAIF, CHACHERE                    MRN:          [id number]  DATE:03/29/2006                            DOB:          [birth year]    Ricky Tate was seen March 29, 2006, at age 60 for complete physical  examination.   He is essentially asymptomatic; he has had minor tennis elbow-type  symptoms.  This is manifested when trying to lift heavy items.  He does  lift weights as part of his exercise program.  His symptoms seem to be  stable and improving.   PAST MEDICAL HISTORY:  Includes negative colonoscopy in December of  1998.  This was done simply to screen because of a family history of  colon cancer in his mother. He has also had a vasectomy. Followup  colonoscopy revealed 2 polyps and repeat colonoscopy is due this year.  He did experience syncope in 2005, diagnosed as a vasovagal phenomenon.   MEDICAL HISTORY:  Dyslipidemia.  Based on NMR, LipoProfile, his LDL  should be less than 120.  He also exhibited Gilbert's syndrome with  fluctuating total bilirubin, which would have no clinical significance.  His mother also had Alzheimer's.  His father had lung cancer and  was a  smoker.  Includes diabetes in a paternal aunt and hypertension in  maternal grandmother, maternal grandfather, paternal grandmother,  paternal grandfather.  Several uncles had bypass surgeries.  His  maternal grandfather and paternal grandfather had strokes.   He has never smoked.  He drinks socially.  In addition to lifting  weights, he will exercise Cardiovascular 1/2 hour per day 2 to 3 days a  week and an hour Saturday and Sunday.  He does been a marathon runner in  the past.   He has increased fiber and oatmeal in his diet.  He is on multivitamins.  Baby aspirin.  Omega 3 supplements.  He has no known drug allergies.   REVIEW OF SYSTEMS:  Completed and was negative.   His blood pressure was  128/84, respiratory rate 14, pulse 64, and weight  170 and stable.  Positive physical findings include a grade 1/2 to 1 systolic murmur.  Thyroid was within normal limits but firm.  Prostatic hypertrophy was suggested without nodules.  Hemoccult test was  negative.  There was slight weakness in the right inguinal area, but no  hernia.   There was no classic tenosynovitis symptomatology with grip and  pronation.   The remainder of the physical examination was totally negative.   EKG was normal.   Labs will be performed; if the LDL remains above 120, then consideration  could be given to low-dose atorvastatin 3 days a week.   Repeat screening colonoscopy will also be scheduled because of the prior  colon polyps and because of the family history.     Titus Dubin. Alwyn Ren, MD,FACP,FCCP  Electronically Signed    [facility]  DD: 03/29/2006  DT: 03/29/2006  Job #: [id number]

## 2010-08-29 ENCOUNTER — Encounter: Payer: Self-pay | Admitting: Internal Medicine

## 2011-02-22 ENCOUNTER — Encounter: Payer: Self-pay | Admitting: Family Medicine

## 2011-02-22 ENCOUNTER — Ambulatory Visit (INDEPENDENT_AMBULATORY_CARE_PROVIDER_SITE_OTHER): Payer: BC Managed Care – PPO | Admitting: Family Medicine

## 2011-02-22 VITALS — BP 118/74 | HR 72 | Temp 99.0°F | Wt 189.0 lb

## 2011-02-22 DIAGNOSIS — J40 Bronchitis, not specified as acute or chronic: Secondary | ICD-10-CM

## 2011-02-22 MED ORDER — AZITHROMYCIN 250 MG PO TABS: ORAL_TABLET | ORAL | Status: AC

## 2011-02-22 NOTE — Patient Instructions (Signed)

## 2011-02-22 NOTE — Progress Notes (Signed)
  Subjective:     Ricky Tate is a 61 y.o. male here for evaluation of a cough. Onset of symptoms was 2 weeks ago. Symptoms have been gradually worsening since that time. The cough is productive and is aggravated by nothing. Associated symptoms include: chills, fever, shortness of breath, sputum production and wheezing. Patient does not have a history of asthma. Patient does not have a history of environmental allergens. Patient has not traveled recently. Patient does not have a history of smoking. Patient has not had a previous chest x-ray. Patient has not had a PPD done.  The following portions of the patient's history were reviewed and updated as appropriate: allergies, current medications, past family history, past medical history, past social history, past surgical history and problem list.  Review of Systems Pertinent items are noted in HPI.    Objective:    Oxygen saturation 97% on room air BP 118/74  Pulse 72  Temp(Src) 99 F (37.2 C) (Oral)  Wt 189 lb (85.73 kg)  SpO2 97% General appearance: alert, cooperative, appears stated age and no distress Ears: normal TM's and external ear canals both ears Nose: Nares normal. Septum midline. Mucosa normal. No drainage or sinus tenderness. Throat: lips, mucosa, and tongue normal; teeth and gums normal Neck: no adenopathy, supple, symmetrical, trachea midline and thyroid not enlarged, symmetric, no tenderness/mass/nodules Lungs: clear to auscultation bilaterally    Assessment:    Acute Bronchitis    Plan:    Antibiotics per medication orders. Avoid exposure to tobacco smoke and fumes. Call if shortness of breath worsens, blood in sputum, change in character of cough, development of fever or chills, inability to maintain nutrition and hydration. Avoid exposure to tobacco smoke and fumes.

## 2011-04-02 ENCOUNTER — Encounter: Payer: Self-pay | Admitting: *Deleted

## 2011-04-04 ENCOUNTER — Encounter: Payer: Self-pay | Admitting: Internal Medicine

## 2011-04-09 ENCOUNTER — Encounter: Payer: Self-pay | Admitting: Internal Medicine

## 2011-05-23 ENCOUNTER — Ambulatory Visit (AMBULATORY_SURGERY_CENTER): Payer: BC Managed Care – PPO | Admitting: *Deleted

## 2011-05-23 ENCOUNTER — Encounter: Payer: Self-pay | Admitting: Internal Medicine

## 2011-05-23 VITALS — Ht 70.0 in | Wt 176.7 lb

## 2011-05-23 DIAGNOSIS — Z1211 Encounter for screening for malignant neoplasm of colon: Secondary | ICD-10-CM

## 2011-05-23 HISTORY — PX: COLONOSCOPY: SHX174

## 2011-05-23 MED ORDER — PEG-KCL-NACL-NASULF-NA ASC-C 100 G PO SOLR: ORAL | Status: DC

## 2011-06-06 ENCOUNTER — Encounter: Payer: BC Managed Care – PPO | Admitting: Internal Medicine

## 2011-06-15 ENCOUNTER — Encounter: Payer: Self-pay | Admitting: Internal Medicine

## 2011-06-15 ENCOUNTER — Ambulatory Visit (AMBULATORY_SURGERY_CENTER): Payer: BC Managed Care – PPO | Admitting: Internal Medicine

## 2011-06-15 VITALS — BP 147/92 | HR 71 | Temp 96.3°F | Resp 14 | Ht 70.0 in | Wt 176.0 lb

## 2011-06-15 DIAGNOSIS — Z1211 Encounter for screening for malignant neoplasm of colon: Secondary | ICD-10-CM

## 2011-06-15 DIAGNOSIS — Z800 Family history of malignant neoplasm of digestive organs: Secondary | ICD-10-CM

## 2011-06-15 MED ORDER — SODIUM CHLORIDE 0.9 % IV SOLN: 500.0000 mL | INTRAVENOUS | Status: DC

## 2011-06-15 NOTE — Patient Instructions (Signed)

## 2011-06-15 NOTE — Progress Notes (Signed)
Patient did not experience any of the following events: a burn prior to discharge; a fall within the facility; wrong site/side/patient/procedure/implant event; or a hospital transfer or hospital admission upon discharge from the facility. (G8907) Patient did not have preoperative order for IV antibiotic SSI prophylaxis. (G8918)  

## 2011-06-15 NOTE — Op Note (Signed)
[facility] [address] [place], [place]  [address]  COLONOSCOPY PROCEDURE REPORT  PATIENT:  Ricky Tate, Ricky Tate  MR#:  [id number] BIRTHDATE:  09-27-1950, 60 yrs. old  GENDER:  male ENDOSCOPIST:  Hedwig Morton. Juanda Chance, MD REF. BY: PROCEDURE DATE:  06/15/2011 PROCEDURE:  Colonoscopy 70263 ASA CLASS:  Class II INDICATIONS:  family history of colon cancer mother with colon cancer prior colon 1998,2000, 2004,2008 MEDICATIONS:   MAC sedation, administered by CRNA, propofol (Diprivan) 260 mg  DESCRIPTION OF PROCEDURE:   After the risks and benefits and of the procedure were explained, informed consent was obtained. Digital rectal exam was performed and revealed no rectal masses. The LB [id number] endoscope was introduced through the anus and advanced to the cecum, which was identified by both the appendix and ileocecal valve.  The quality of the prep was excellent, using MoviPrep.  The instrument was then slowly withdrawn as the colon was fully examined. <<PROCEDUREIMAGES>>  FINDINGS:  No polyps or cancers were seen (see image1, image2, image3, and image4).   Retroflexed views in the rectum revealed no abnormalities.    The scope was then withdrawn from the patient and the procedure completed.  COMPLICATIONS:  None ENDOSCOPIC IMPRESSION: 1) No polyps or cancers 2) Normal colonoscopy RECOMMENDATIONS: 1) High fiber diet.  REPEAT EXAM:  In 5 year(s) for.  ______________________________ Hedwig Morton. Juanda Chance, MD  CC:  Pecola Lawless, MD  n. Rosalie DoctorHedwig Morton. Naz Denunzio at 06/15/2011 09:45 AM  Lillia Abed, [phone]

## 2011-06-19 ENCOUNTER — Telehealth: Payer: Self-pay

## 2011-06-19 NOTE — Telephone Encounter (Signed)
Left message on answering machine. 

## 2011-07-11 ENCOUNTER — Other Ambulatory Visit: Payer: Self-pay | Admitting: Internal Medicine

## 2011-07-12 NOTE — Telephone Encounter (Signed)
Patient needs to schedule a CPX  

## 2011-10-11 ENCOUNTER — Encounter: Payer: Self-pay | Admitting: Internal Medicine

## 2011-10-11 ENCOUNTER — Ambulatory Visit (INDEPENDENT_AMBULATORY_CARE_PROVIDER_SITE_OTHER): Payer: BC Managed Care – PPO | Admitting: Internal Medicine

## 2011-10-11 VITALS — BP 150/98 | HR 72 | Temp 97.5°F | Wt 183.8 lb

## 2011-10-11 DIAGNOSIS — L409 Psoriasis, unspecified: Secondary | ICD-10-CM | POA: Insufficient documentation

## 2011-10-11 DIAGNOSIS — I10 Essential (primary) hypertension: Secondary | ICD-10-CM

## 2011-10-11 MED ORDER — PROPRANOLOL HCL 20 MG PO TABS: 20.0000 mg | ORAL_TABLET | Freq: Two times a day (BID) | ORAL | Status: DC

## 2011-10-11 NOTE — Assessment & Plan Note (Signed)
He is on propranolol 20 mg once daily; dose will be increased to one every 12 hours. This medication was prescribed for hypertension and intention tremor. He states it has helped the latter. Blood pressure goals discussed.

## 2011-10-11 NOTE — Patient Instructions (Addendum)
Please take enteric-coated aspirin 81 mg daily with breakfast.  Please bring blood pressure cuff & BP diary of readings to all office visits. An alternative is to check home BP  cuff readings against the machine at the drugstore. Blood Pressure Goal  Ideally is an AVERAGE < 135/85. This AVERAGE should be calculated from @ least 5-7 BP readings taken @ different times of day on different days of week. You should not respond to isolated BP readings , but rather the AVERAGE for that week

## 2011-10-11 NOTE — Progress Notes (Signed)
  Subjective:    Patient ID: JERRAMIE BENDAVID, male    DOB: 1950-05-06, 61 y.o.   MRN: [id number]  HPI   HYPERTENSION:  Disease Monitoring  Blood pressure range: 111/72- 139/93 over 2 mos Medication compliance:not on Amlodipine ; never got Rx filled for ? reason Medication Side Effects  Lightheadedness: no   Urinary frequency:no  Edema: no   Preventitive Healthcare:  Exercise: 4-5 X/ week as weights & fast walking   Diet Pattern: not specific, but low alcohol,sugar & fat  Salt Restriction: yes     Review of Systems   Chest pain: no  Dyspnea: no             Claudication: no        Objective:   Physical Exam He appears healthy and well-nourished; he is in no acute distress  No carotid bruits are present.  Heart rhythm and rate are normal with no significant murmurs or gallops. S 4  Chest is clear with no increased work of breathing  There is no evidence of aortic aneurysm or renal artery bruits  He has no clubbing or edema.   Pedal pulses are intact   No ischemic skin changes are present       Assessment & Plan:

## 2012-03-08 ENCOUNTER — Other Ambulatory Visit: Payer: Self-pay

## 2012-03-11 ENCOUNTER — Telehealth: Payer: Self-pay | Admitting: Internal Medicine

## 2012-03-11 NOTE — Telephone Encounter (Signed)
Patient Information:  Caller Name: Rohith  Phone: [phone]  Patient: Ricky Tate, Ricky Tate  Gender: Male  DOB: 1950/07/08  Age: 62 Years  PCP: Marga Melnick  Office Follow Up:  Does the office need to follow up with this patient?: No  Instructions For The Office: N/A   Symptoms  Reason For Call & Symptoms: Head cold with congestion; afebrile.  Dry, non-productive cough that interferes with sleep.  Reviewed Health History In EMR: Yes  Reviewed Medications In EMR: Yes  Reviewed Allergies In EMR: Yes  Reviewed Surgeries / Procedures: Yes  Date of Onset of Symptoms: 03/07/2012  Treatments Tried: Dayquil, Nyquil, Mucinex  Treatments Tried Worked: No  Guideline(s) Used:  Colds  Disposition Per Guideline:   See Today or Tomorrow in Office  Reason For Disposition Reached:   Patient wants to be seen  Advice Given:  For a Stuffy Nose - Use Nasal Washes:  Introduction: Saline (salt water) nasal irrigation (nasal wash) is an effective and simple home remedy for treating stuffy nose and sinus congestion. The nose can be irrigated by pouring, spraying, or squirting salt water into the nose and then letting it run back out.  How it Helps: The salt water rinses out excess mucus, washes out any irritants (dust, allergens) that might be present, and moistens the nasal cavity.  Methods: There are several ways to perform nasal irrigation. You can use a saline nasal spray bottle (available over-the-counter), a rubber ear syringe, a medical syringe without the needle, or a Neti Pot.  Treatment for Associated Symptoms of Colds:  For muscle aches, headaches, or moderate fever (more than 101 F or 38.9 C): Take acetaminophen every 4 hours.  Sore throat: Try throat lozenges, hard candy, or warm chicken broth.  Cough: Use cough drops.  Hydrate: Drink adequate liquids.  Humidifier:  If the air in your home is dry, use a cool-mist humidifier  Contagiousness:  Cover your nose and mouth with a  tissue when you sneeze or cough.  Wash your hands frequently with soap and water.  You can return to work or school after the fever is gone and you feel well enough to participate in normal activities.  Call Back If:  Difficulty breathing occurs  You become worse  Appointment Scheduled:  03/12/2012 11:30:00 Appointment Scheduled Provider:  Marga Melnick

## 2012-03-12 ENCOUNTER — Encounter: Payer: Self-pay | Admitting: Internal Medicine

## 2012-03-12 ENCOUNTER — Ambulatory Visit (INDEPENDENT_AMBULATORY_CARE_PROVIDER_SITE_OTHER): Payer: BC Managed Care – PPO | Admitting: Internal Medicine

## 2012-03-12 VITALS — BP 128/90 | HR 66 | Temp 98.2°F | Wt 186.0 lb

## 2012-03-12 DIAGNOSIS — J209 Acute bronchitis, unspecified: Secondary | ICD-10-CM

## 2012-03-12 MED ORDER — AZITHROMYCIN 250 MG PO TABS: ORAL_TABLET | ORAL | Status: DC

## 2012-03-12 NOTE — Patient Instructions (Addendum)
Plain Mucinex (NOT D) for thick secretions ;force NON dairy fluids .   Nasal cleansing in the shower as discussed with lather of mild shampoo.After 10 seconds wash off lather while  exhaling through nostrils. Make sure that all residual soap is removed to prevent irritation.  Use a Neti pot daily only  as needed for significant sinus congestion; going from open side to congested side . Plain Allegra (NOT D )  160 daily , Loratidine 10 mg , OR Zyrtec 10 mg @ bedtime  as needed for itchy eyes & sneezing.     

## 2012-03-12 NOTE — Progress Notes (Signed)
  Subjective:    Patient ID: Ricky Tate, male    DOB: 11/29/1950, 62 y.o.   MRN: [id number]  HPI The respiratory tract symptoms began 1 week ago  as  rhinitis  &  head congestion.As of 2/14  dry cough without sputum appeared.  Exposures to sick family & work associates .   Cough is now associated with sputum production described as yellow  wth wheezing but no dyspnea.   Fever ,chills, &  sweats  were present ; temp up to  99.      Flu shot  current        Treatment with Waltussin DM  , Mucinex  was partially effective   There is no history of asthma; PMH of mild seasonal allergies. The patient had never smoked .              Review of Systems Symptoms not present include frontal headache, facial pain, dental pain, sore throat nasal, purulence earache ,or otic discharge Itchy  /watery eyes & sneezing were not noted.  Myalgias and arthralgias were not present       Objective:   Physical Exam  General appearance:good health ;well nourished; no acute distress or increased work of breathing is present.   Eyes: No conjunctival inflammation or lid edema is present.  Ears:  External ear exam shows no significant lesions or deformities.  Otoscopic examination reveals wax on R; L  tympanic membrane  intact without bulging, retraction, inflammation or discharge. Nose:  External nasal examination shows no deformity or inflammation. Nasal mucosa are pink and moist without lesions or exudates. No septal dislocation or deviation.No obstruction to airflow.  Oral exam: Dental hygiene is good; lips and gums are healthy appearing.There is no oropharyngeal erythema or exudate noted.  Neck:  No deformities, masses, or tenderness noted.     Heart:  Normal rate and regular rhythm. S1 and S2 normal without gallop, murmur, click, rub or other extra sounds.  Lungs:Chest clear to auscultation; no wheezes, rhonchi,rales ,or rubs present.No increased work of breathing.    Extremities:  No cyanosis, edema, or clubbing  noted  No  lymphadenopathy about the head, neck, or axilla noted.  Skin: Warm & dry           Assessment & Plan:  #1 acute bronchitis w/o bronchospasm #2 no rhinosinusitis Plan: See orders and recommendations

## 2012-11-01 ENCOUNTER — Encounter (HOSPITAL_COMMUNITY): Payer: Self-pay | Admitting: Emergency Medicine

## 2012-11-01 ENCOUNTER — Emergency Department (INDEPENDENT_AMBULATORY_CARE_PROVIDER_SITE_OTHER)
Admission: EM | Admit: 2012-11-01 | Discharge: 2012-11-01 | Disposition: A | Discharge status: Home / Self Care | Payer: BC Managed Care – PPO | Source: Home / Self Care | Attending: Family Medicine | Admitting: Family Medicine

## 2012-11-01 DIAGNOSIS — J069 Acute upper respiratory infection, unspecified: Secondary | ICD-10-CM

## 2012-11-01 MED ORDER — AZITHROMYCIN 250 MG PO TABS: ORAL_TABLET | ORAL | Status: DC

## 2012-11-01 MED ORDER — HYDROCOD POLST-CHLORPHEN POLST 10-8 MG/5ML PO LQCR: 5.0000 mL | Freq: Two times a day (BID) | ORAL | Status: DC | PRN

## 2012-11-01 NOTE — ED Provider Notes (Signed)
CSN: [id number]     Arrival date & time 11/01/12  1116 History   First MD Initiated Contact with Patient 11/01/12 1230     Chief Complaint  Patient presents with  . Cough   (Consider location/radiation/quality/duration/timing/severity/associated sxs/prior Treatment) Patient is a 62 y.o. male presenting with cough. The history is provided by the patient.  Cough Cough characteristics:  Non-productive Severity:  Mild Onset quality:  Gradual Duration:  1 week Timing:  Intermittent Progression:  Unchanged Chronicity:  New Smoker: no   Context: sick contacts and weather changes   Context comment:  Traveling in [place] last week, got home yest. no fever. Ineffective treatments:  None tried Associated symptoms: no chills, no fever, no myalgias, no rash, no rhinorrhea, no shortness of breath, no sore throat and no wheezing   Risk factors: recent travel   Risk factors: no recent infection     Past Medical History  Diagnosis Date  . Hypertension   . Hyperplastic colon polyp     pmh  . Benign prostatic hypertrophy   . Cancer     skin,squamous cell..pmh of  . Gilbert's syndrome   . Hyperlipidemia     NMR,LDL 308(6578/469) HDL 77,TG61  . Syncope    Past Surgical History  Procedure Laterality Date  . Colonoscopy w/ polypectomy  2008  . Vasectomy    . Colonoscopy  05/2011     Dr Juanda Chance   Family History  Problem Relation Age of Onset  . Alzheimer's disease Mother   . Colon cancer Mother 49  . Lung cancer Father     smoker  . Alzheimer's disease Maternal Aunt     x2 aunts.x 2  . Alzheimer's disease Maternal Uncle     x 2  . Heart disease Paternal Uncle     CABG,CAD  . Hypertension Maternal Grandmother   . Stroke Maternal Grandmother   . Hypertension Maternal Grandfather   . Stroke Maternal Grandfather   . Hypertension Paternal Grandmother   . Stroke Paternal Grandmother   . Hypertension Paternal Grandfather   . Stroke Paternal Grandfather   . Schizophrenia      aunt   . Stomach cancer Neg Hx    History  Substance Use Topics  . Smoking status: Never Smoker   . Smokeless tobacco: Never Used  . Alcohol Use: Yes     Comment: socially    Review of Systems  Constitutional: Negative for fever and chills.  HENT: Negative for rhinorrhea and sore throat.   Respiratory: Positive for cough. Negative for shortness of breath and wheezing.   Cardiovascular: Negative.   Musculoskeletal: Negative for myalgias.  Skin: Negative for rash.    Allergies  Review of patient's allergies indicates no known allergies.  Home Medications   Current Outpatient Rx  Name  Route  Sig  Dispense  Refill  . azithromycin (ZITHROMAX Z-PAK) 250 MG tablet      2 day 1, then 1 qd   6 each   0   . azithromycin (ZITHROMAX Z-PAK) 250 MG tablet      Take as directed on pack   6 each   0   . chlorpheniramine-HYDROcodone (TUSSIONEX PENNKINETIC ER) 10-8 MG/5ML LQCR   Oral   Take 5 mLs by mouth every 12 (twelve) hours as needed.   115 mL   1   . Clobetasol Propionate 0.05 % shampoo   Topical   Apply topically 3 (three) times a week.          Marland Kitchen  propranolol (INDERAL) 20 MG tablet   Oral   Take 1 tablet (20 mg total) by mouth 2 (two) times daily.   180 tablet   3    BP 170/104  Pulse 63  Temp(Src) 98.5 F (36.9 C) (Oral)  Resp 18  SpO2 98% Physical Exam  Nursing note and vitals reviewed. Constitutional: He is oriented to person, place, and time. He appears well-developed and well-nourished. No distress.  HENT:  Head: Normocephalic.  Right Ear: External ear normal.  Left Ear: External ear normal.  Mouth/Throat: Oropharynx is clear and moist.  Eyes: Conjunctivae are normal. Pupils are equal, round, and reactive to light.  Neck: Normal range of motion. Neck supple.  Cardiovascular: Regular rhythm, normal heart sounds and intact distal pulses.   Pulmonary/Chest: Effort normal and breath sounds normal. No respiratory distress. He has no wheezes. He has no rales.   Lymphadenopathy:    He has no cervical adenopathy.  Neurological: He is alert and oriented to person, place, and time.  Skin: Skin is warm and dry.    ED Course  Procedures (including critical care time) Labs Review Labs Reviewed - No data to display Imaging Review No results found.    MDM      Linna Hoff, MD 11/01/12 [phone]

## 2012-11-01 NOTE — ED Notes (Signed)
Pt  Reports  Symptoms  Of  Cough /  Congestion  With  The  Symptoms  Starting       About  1  Week  Ago  He  Reports  Some  Pain  In  His  Sides from the  Cough  -  He  Is  Sitting  Upright on  Exam table          In no  Acute  Distress  Speaking in  Complete  sentances

## 2012-11-10 ENCOUNTER — Other Ambulatory Visit: Payer: Self-pay | Admitting: Internal Medicine

## 2012-11-10 NOTE — Telephone Encounter (Signed)
Propranolol refill sent to pharmacy 

## 2012-11-27 ENCOUNTER — Other Ambulatory Visit: Payer: Self-pay

## 2013-02-03 ENCOUNTER — Other Ambulatory Visit: Payer: Self-pay | Admitting: Internal Medicine

## 2013-02-04 NOTE — Telephone Encounter (Signed)
Propranolol refilled per protocol. JG//CMA

## 2013-06-01 ENCOUNTER — Other Ambulatory Visit: Payer: Self-pay | Admitting: Internal Medicine

## 2013-06-06 ENCOUNTER — Other Ambulatory Visit: Payer: Self-pay | Admitting: Internal Medicine

## 2013-07-20 ENCOUNTER — Encounter: Payer: Self-pay | Admitting: Internal Medicine

## 2013-07-20 ENCOUNTER — Ambulatory Visit (INDEPENDENT_AMBULATORY_CARE_PROVIDER_SITE_OTHER): Payer: BC Managed Care – PPO | Admitting: Internal Medicine

## 2013-07-20 ENCOUNTER — Other Ambulatory Visit (INDEPENDENT_AMBULATORY_CARE_PROVIDER_SITE_OTHER): Payer: BC Managed Care – PPO

## 2013-07-20 VITALS — BP 138/90 | HR 62 | Temp 98.3°F | Ht 70.0 in | Wt 180.2 lb

## 2013-07-20 DIAGNOSIS — Z0000 Encounter for general adult medical examination without abnormal findings: Secondary | ICD-10-CM

## 2013-07-20 LAB — CBC WITH DIFFERENTIAL/PLATELET
Basophils Absolute: 0 10*3/uL (ref 0.0–0.1)
Basophils Relative: 0.8 % (ref 0.0–3.0)
Eosinophils Absolute: 0.2 10*3/uL (ref 0.0–0.7)
Eosinophils Relative: 4.2 % (ref 0.0–5.0)
HCT: 45 % (ref 39.0–52.0)
Hemoglobin: 15.4 g/dL (ref 13.0–17.0)
Lymphocytes Relative: 28.5 % (ref 12.0–46.0)
Lymphs Abs: 1.6 10*3/uL (ref 0.7–4.0)
MCHC: 34.3 g/dL (ref 30.0–36.0)
MCV: 92.9 fl (ref 78.0–100.0)
MONO ABS: 0.5 10*3/uL (ref 0.1–1.0)
Monocytes Relative: 8.8 % (ref 3.0–12.0)
Neutro Abs: 3.3 10*3/uL (ref 1.4–7.7)
Neutrophils Relative %: 57.7 % (ref 43.0–77.0)
PLATELETS: 206 10*3/uL (ref 150.0–400.0)
RBC: 4.84 Mil/uL (ref 4.22–5.81)
RDW: 12.5 % (ref 11.5–15.5)
WBC: 5.8 10*3/uL (ref 4.0–10.5)

## 2013-07-20 LAB — BASIC METABOLIC PANEL
BUN: 13 mg/dL (ref 6–23)
CO2: 27 meq/L (ref 19–32)
Calcium: 9.9 mg/dL (ref 8.4–10.5)
Chloride: 100 mEq/L (ref 96–112)
Creatinine, Ser: 1 mg/dL (ref 0.4–1.5)
GFR: 81.45 mL/min (ref >60.00)
Glucose, Bld: 93 mg/dL (ref 70–99)
POTASSIUM: 4.4 meq/L (ref 3.5–5.1)
Sodium: 137 mEq/L (ref 135–145)

## 2013-07-20 LAB — HEPATIC FUNCTION PANEL
ALT: 24 U/L (ref 0–53)
AST: 29 U/L (ref 0–37)
Albumin: 4.6 g/dL (ref 3.5–5.2)
Alkaline Phosphatase: 51 U/L (ref 39–117)
BILIRUBIN TOTAL: 1.2 mg/dL (ref 0.2–1.2)
Bilirubin, Direct: 0.2 mg/dL (ref 0.0–0.3)
Total Protein: 7.7 g/dL (ref 6.0–8.3)

## 2013-07-20 LAB — LIPID PANEL
CHOLESTEROL: 182 mg/dL (ref 0–200)
HDL: 50.4 mg/dL (ref >39.00)
LDL CALC: 122 mg/dL — AB (ref 0–99)
NonHDL: 131.6
TRIGLYCERIDES: 50 mg/dL (ref 0.0–149.0)
Total CHOL/HDL Ratio: 4
VLDL: 10 mg/dL (ref 0.0–40.0)

## 2013-07-20 LAB — TSH: TSH: 2.57 u[IU]/mL (ref 0.35–4.50)

## 2013-07-20 NOTE — Patient Instructions (Signed)
Your next office appointment will be determined based upon review of your pending labs . Those instructions will be transmitted to you through My Chart .  As per the Standard of Care , screening Colonoscopy recommended @ 50 & every 5-10 years thereafter . More frequent monitor would be dictated by family history or  Adenomatous  ( NOT hyperplastic) polyp @ Colonoscopy

## 2013-07-20 NOTE — Progress Notes (Signed)
Pre visit review using our clinic review tool, if applicable. No additional management support is needed unless otherwise documented below in the visit note. 

## 2013-07-20 NOTE — Progress Notes (Signed)
   Subjective:    Patient ID: Ricky Tate, male    DOB: [birth year], 63 y.o.   MRN: [id number]  HPI He is here for a physical;acute issues denied.   A heart healthy, low sodium diet is followed; exercise encompasses 45 minutes 6  times per week as  Walk/jog and weights without symptoms.    Family history is neg for premature coronary disease. Advanced cholesterol testing reveals  LDL goal is less than 125; ideally < 95. There is medication compliance with the beta blocker. BP @ home 102/67- 145/99. Low dose ASA not taken   Review of Systems    Specifically denied are  chest pain, palpitations, dyspnea, or claudication.  Significant abdominal symptoms, memory deficit, or myalgias not present.       Objective:   Physical Exam  Gen.: Healthy and well-nourished in appearance. Alert, appropriate and cooperative throughout exam. Appears younger than stated age  Head: Normocephalic without obvious abnormalities; no alopecia  Eyes: No corneal or conjunctival inflammation noted. Pupils equal round reactive to light and accommodation. Extraocular motion intact.  Ears: External  ear exam reveals no significant lesions or deformities. Wax R > L.. Hearing is grossly normal bilaterally. Nose: External nasal exam reveals no deformity or inflammation. Nasal mucosa are pink and moist. No lesions or exudates noted.   Mouth: Oral mucosa and oropharynx reveal no lesions or exudates. Teeth in good repair. Neck: No deformities, masses, or tenderness noted. Range of motion decreased. Thyroid normal. Lungs: Normal respiratory effort; chest expands symmetrically. Lungs are clear to auscultation without rales, wheezes, or increased work of breathing. Heart: Normal rate and rhythm. Normal S1 and S2. No gallop, click, or rub. S4 w/o murmur. Abdomen: Bowel sounds normal; abdomen soft and nontender. No masses, organomegaly or hernias noted. Genitalia: Genitalia normal except for left varices. Prostate  is normal without enlargement, asymmetry, nodularity, or induration                                 Musculoskeletal/extremities: No deformity or scoliosis noted of  the thoracic or lumbar spine.  No clubbing, cyanosis, edema, or significant extremity  deformity noted. Range of motion normal .Tone & strength normal. Hand joints normal  Fingernail health good. Able to lie down & sit up w/o help. Negative SLR bilaterally Vascular: Carotid, radial artery, dorsalis pedis and  posterior tibial pulses are full and equal. No bruits present. Neurologic: Alert and oriented x3. Deep tendon reflexes symmetrical and normal.  Gait normal.       Skin: Intact without suspicious lesions or rashes. Lymph: No cervical, axillary, or inguinal lymphadenopathy present. Psych: Mood and affect are normal. Normally interactive                                                                                      Assessment & Plan:  #1 comprehensive physical exam; no acute findings  Plan: see Orders  & Recommendations

## 2013-10-19 ENCOUNTER — Other Ambulatory Visit: Payer: Self-pay | Admitting: Internal Medicine

## 2013-11-06 ENCOUNTER — Other Ambulatory Visit: Payer: Self-pay

## 2013-11-09 ENCOUNTER — Telehealth: Payer: Self-pay | Admitting: Internal Medicine

## 2013-11-09 NOTE — Telephone Encounter (Signed)
Flu now & shingles in 1 month

## 2013-11-09 NOTE — Telephone Encounter (Signed)
Patient is requesting shingles and flu vac.

## 2013-11-10 NOTE — Telephone Encounter (Signed)
Got scheduled  °

## 2013-11-18 ENCOUNTER — Ambulatory Visit (INDEPENDENT_AMBULATORY_CARE_PROVIDER_SITE_OTHER): Payer: BC Managed Care – PPO

## 2013-11-18 DIAGNOSIS — Z23 Encounter for immunization: Secondary | ICD-10-CM

## 2013-12-22 ENCOUNTER — Ambulatory Visit (INDEPENDENT_AMBULATORY_CARE_PROVIDER_SITE_OTHER): Payer: BC Managed Care – PPO | Admitting: *Deleted

## 2013-12-22 DIAGNOSIS — Z23 Encounter for immunization: Secondary | ICD-10-CM

## 2014-07-05 ENCOUNTER — Other Ambulatory Visit: Payer: Self-pay | Admitting: Emergency Medicine

## 2014-07-05 MED ORDER — PROPRANOLOL HCL 20 MG PO TABS: 20.0000 mg | ORAL_TABLET | Freq: Two times a day (BID) | ORAL | Status: DC

## 2014-08-24 ENCOUNTER — Other Ambulatory Visit: Payer: Self-pay | Admitting: Internal Medicine

## 2014-08-25 ENCOUNTER — Other Ambulatory Visit: Payer: Self-pay | Admitting: Emergency Medicine

## 2014-08-25 MED ORDER — PROPRANOLOL HCL 20 MG PO TABS: 20.0000 mg | ORAL_TABLET | Freq: Two times a day (BID) | ORAL | Status: DC

## 2014-10-11 ENCOUNTER — Other Ambulatory Visit: Payer: Self-pay | Admitting: Internal Medicine

## 2014-10-15 ENCOUNTER — Telehealth: Payer: Self-pay | Admitting: Internal Medicine

## 2014-10-15 MED ORDER — PROPRANOLOL HCL 20 MG PO TABS: 20.0000 mg | ORAL_TABLET | Freq: Two times a day (BID) | ORAL | Status: DC

## 2014-10-15 NOTE — Telephone Encounter (Signed)
Patient has an appointment on Monday, but he is totally out of propranolol (INDERAL) 20 MG tablet [[id number] Can you send in enough to till then Pharmacy is [facility] on [facility]

## 2014-10-15 NOTE — Telephone Encounter (Signed)
30 day sent until appt...Ricky Tate

## 2014-10-18 ENCOUNTER — Ambulatory Visit (INDEPENDENT_AMBULATORY_CARE_PROVIDER_SITE_OTHER): Payer: 59 | Admitting: Internal Medicine

## 2014-10-18 ENCOUNTER — Other Ambulatory Visit (INDEPENDENT_AMBULATORY_CARE_PROVIDER_SITE_OTHER): Payer: 59

## 2014-10-18 ENCOUNTER — Encounter: Payer: Self-pay | Admitting: Internal Medicine

## 2014-10-18 VITALS — BP 148/90 | HR 64 | Temp 98.6°F | Resp 16 | Ht 70.0 in | Wt 187.0 lb

## 2014-10-18 DIAGNOSIS — R251 Tremor, unspecified: Secondary | ICD-10-CM

## 2014-10-18 DIAGNOSIS — Z0000 Encounter for general adult medical examination without abnormal findings: Secondary | ICD-10-CM

## 2014-10-18 DIAGNOSIS — I10 Essential (primary) hypertension: Secondary | ICD-10-CM | POA: Diagnosis not present

## 2014-10-18 DIAGNOSIS — Z0189 Encounter for other specified special examinations: Secondary | ICD-10-CM

## 2014-10-18 DIAGNOSIS — G250 Essential tremor: Secondary | ICD-10-CM

## 2014-10-18 DIAGNOSIS — G252 Other specified forms of tremor: Secondary | ICD-10-CM

## 2014-10-18 LAB — LIPID PANEL
CHOLESTEROL: 218 mg/dL — AB (ref 0–200)
HDL: 73.6 mg/dL (ref >39.00)
LDL CALC: 110 mg/dL — AB (ref 0–99)
NonHDL: 144.31
TRIGLYCERIDES: 173 mg/dL — AB (ref 0.0–149.0)
Total CHOL/HDL Ratio: 3
VLDL: 34.6 mg/dL (ref 0.0–40.0)

## 2014-10-18 LAB — HEPATIC FUNCTION PANEL
ALT: 32 U/L (ref 0–53)
AST: 32 U/L (ref 0–37)
Albumin: 4.5 g/dL (ref 3.5–5.2)
Alkaline Phosphatase: 77 U/L (ref 39–117)
BILIRUBIN TOTAL: 1.4 mg/dL — AB (ref 0.2–1.2)
Bilirubin, Direct: 0.2 mg/dL (ref 0.0–0.3)
Total Protein: 7.6 g/dL (ref 6.0–8.3)

## 2014-10-18 LAB — BASIC METABOLIC PANEL
BUN: 12 mg/dL (ref 6–23)
CO2: 28 mEq/L (ref 19–32)
CREATININE: 1 mg/dL (ref 0.40–1.50)
Calcium: 10.4 mg/dL (ref 8.4–10.5)
Chloride: 98 mEq/L (ref 96–112)
GFR: 80.18 mL/min (ref >60.00)
GLUCOSE: 139 mg/dL — AB (ref 70–99)
POTASSIUM: 3.8 meq/L (ref 3.5–5.1)
Sodium: 139 mEq/L (ref 135–145)

## 2014-10-18 LAB — CBC WITH DIFFERENTIAL/PLATELET
Basophils Absolute: 0 10*3/uL (ref 0.0–0.1)
Basophils Relative: 0.2 % (ref 0.0–3.0)
EOS PCT: 1.8 % (ref 0.0–5.0)
Eosinophils Absolute: 0.1 10*3/uL (ref 0.0–0.7)
HCT: 48.7 % (ref 39.0–52.0)
Hemoglobin: 16.6 g/dL (ref 13.0–17.0)
LYMPHS ABS: 1.1 10*3/uL (ref 0.7–4.0)
Lymphocytes Relative: 14.1 % (ref 12.0–46.0)
MCHC: 34.2 g/dL (ref 30.0–36.0)
MCV: 93.3 fl (ref 78.0–100.0)
MONO ABS: 0.5 10*3/uL (ref 0.1–1.0)
Monocytes Relative: 6.6 % (ref 3.0–12.0)
Neutro Abs: 6 10*3/uL (ref 1.4–7.7)
Neutrophils Relative %: 77.3 % — ABNORMAL HIGH (ref 43.0–77.0)
Platelets: 232 10*3/uL (ref 150.0–400.0)
RBC: 5.22 Mil/uL (ref 4.22–5.81)
RDW: 13.6 % (ref 11.5–15.5)
WBC: 7.7 10*3/uL (ref 4.0–10.5)

## 2014-10-18 LAB — TSH: TSH: 3.49 u[IU]/mL (ref 0.35–4.50)

## 2014-10-18 MED ORDER — PROPRANOLOL HCL 20 MG PO TABS: 20.0000 mg | ORAL_TABLET | Freq: Two times a day (BID) | ORAL | Status: DC

## 2014-10-18 NOTE — Progress Notes (Signed)
Pre visit review using our clinic review tool, if applicable. No additional management support is needed unless otherwise documented below in the visit note. 

## 2014-10-18 NOTE — Patient Instructions (Signed)
  Your next office appointment will be determined based upon review of your pending labs . Those written interpretation of the lab results and instructions will be transmitted to you by My Chart  Critical results will be called.   Followup as needed for any active or acute issue. Please report any significant change in your symptoms.    Minimal Blood Pressure Goal= AVERAGE < 140/90;  Ideal is an AVERAGE < 135/85. This AVERAGE should be calculated from @ least 5-7 BP readings taken @ different times of day on different days of week. You should not respond to isolated BP readings , but rather the AVERAGE for that week .Please bring your  blood pressure cuff to office visits to verify that it is reliable.It  can also be checked against the blood pressure device at the pharmacy. Finger or wrist cuffs are not dependable; an arm cuff is. 

## 2014-10-18 NOTE — Progress Notes (Signed)
   Subjective:    Patient ID: Ricky Tate, male    DOB: Mar 03, 1950, 64 y.o.   MRN: [id number]  HPI The patient is here for a physical to assess status of active health conditions.  PMH, FH, & Social History reviewed & updated.No change in [place] as recorded.  He is on a heart healthy diet but does eat some red meat and some fried seafoods. He does not add salt at the table. He walks 30-45 minutes 6 days a week without cardio pulmonary symptoms  He did have the sensation of a "knot "in the epigastric area which lasted 24 hours after he missed his propranolol for 3-4 days last week. There was associated slight shortness of breath but no chest pain. Propranolol has been of benefit in controlling his tremor. On propranolol his blood pressure averages less than 135/85.  Yesterday he had dyspepsia, some tremor, alternating chills and sweats and pain when drinking fluids. He was nauseated & did vomit twice yesterday.  He has no other GI symptoms. Colonoscopy was negative in 2013; it is due in 2018 because of history of colon polyps.  3 weeks ago he had upper respiratory tract congestion symptoms with associated dizziness. This has resolved. He has no active signs of respiratory tract infection.  Review of Systems Chest pain, palpitations, tachycardia, exertional dyspnea, paroxysmal nocturnal dyspnea, claudication or edema are absent except as noted. No unexplained weight loss, abdominal pain, melena, rectal bleeding, or persistently small caliber stools. Dysuria, pyuria, hematuria, frequency, nocturia or polyuria are denied. PSA was 0.81 in 2012. Change in hair, skin, nails denied. He does have some psoriasis in the scalp.  No bowel changes of constipation or diarrhea. No intolerance to heat or cold.    Objective:   Physical Exam  General appearance :adequately nourished; in no distress.  Eyes: No conjunctival inflammation or scleral icterus is present.   Ears:Wax is present in both otic  canals.  Oral exam:  Lips and gums are healthy appearing.There is no oropharyngeal erythema or exudate noted. Dental hygiene is good.  Heart:  Normal rate and regular rhythm. S1 and S2 normal without gallop, murmur, click, rub or other extra sounds    Lungs:Chest clear to auscultation; no wheezes, rhonchi,rales ,or rubs present.No increased work of breathing.   Abdomen: bowel sounds normal, soft and non-tender without masses, organomegaly or hernias noted.  No guarding or rebound.   Genitourinary: Genitalia are normal. Prostate is normal without enlargement, nodularity, or induration.  Vascular : all pulses equal ; no bruits present.  Skin:Warm & dry.  Intact without suspicious lesions or rashes ; no tenting or jaundice   Lymphatic: No lymphadenopathy is noted about the head, neck, axilla, or inguinal areas.   Neuro: Strength, tone & DTRs normal.     Assessment & Plan:  #1 comprehensive physical exam; no acute findings  Plan: see Orders  & Recommendations

## 2014-10-19 ENCOUNTER — Other Ambulatory Visit (INDEPENDENT_AMBULATORY_CARE_PROVIDER_SITE_OTHER): Payer: 59

## 2014-10-19 DIAGNOSIS — R739 Hyperglycemia, unspecified: Secondary | ICD-10-CM | POA: Diagnosis not present

## 2014-10-19 LAB — HEMOGLOBIN A1C: Hgb A1c MFr Bld: 5.4 % (ref 4.6–6.5)

## 2015-03-24 ENCOUNTER — Other Ambulatory Visit: Payer: Self-pay | Admitting: Internal Medicine

## 2015-03-25 NOTE — Telephone Encounter (Signed)
Left message advising patient to call back to get established/schedule appt with new pcp---patient just seen by dr hopper in 09/2014, so next appt can be aug/sept 2017---but i need new pcp name so that refill for propanolol can be sent to patient pharm---let Deondra Wigger know when patient makes appt so that refill can be sent

## 2015-03-29 ENCOUNTER — Telehealth: Payer: Self-pay

## 2015-03-29 ENCOUNTER — Other Ambulatory Visit: Payer: Self-pay

## 2015-03-29 MED ORDER — PROPRANOLOL HCL 20 MG PO TABS: 20.0000 mg | ORAL_TABLET | Freq: Two times a day (BID) | ORAL | Status: DC

## 2015-03-29 NOTE — Telephone Encounter (Signed)
Recd faxed rx refill request for propanolol from patients pharm, i have sent rx in and patient has scheduled appt with dr burns to get established in April/2017

## 2015-05-16 ENCOUNTER — Ambulatory Visit: Payer: 59 | Admitting: Internal Medicine

## 2015-05-31 ENCOUNTER — Encounter: Payer: Self-pay | Admitting: Internal Medicine

## 2015-05-31 ENCOUNTER — Ambulatory Visit (INDEPENDENT_AMBULATORY_CARE_PROVIDER_SITE_OTHER): Payer: 59 | Admitting: Internal Medicine

## 2015-05-31 ENCOUNTER — Other Ambulatory Visit (INDEPENDENT_AMBULATORY_CARE_PROVIDER_SITE_OTHER): Payer: 59

## 2015-05-31 VITALS — BP 142/94 | HR 56 | Temp 98.0°F | Resp 16 | Ht 70.0 in | Wt 188.0 lb

## 2015-05-31 DIAGNOSIS — I10 Essential (primary) hypertension: Secondary | ICD-10-CM

## 2015-05-31 DIAGNOSIS — G252 Other specified forms of tremor: Secondary | ICD-10-CM | POA: Diagnosis not present

## 2015-05-31 DIAGNOSIS — E785 Hyperlipidemia, unspecified: Secondary | ICD-10-CM

## 2015-05-31 LAB — CBC WITH DIFFERENTIAL/PLATELET
BASOS PCT: 1.1 % (ref 0.0–3.0)
Basophils Absolute: 0.1 10*3/uL (ref 0.0–0.1)
Eosinophils Absolute: 0.2 10*3/uL (ref 0.0–0.7)
Eosinophils Relative: 3.9 % (ref 0.0–5.0)
HEMATOCRIT: 47.1 % (ref 39.0–52.0)
HEMOGLOBIN: 16.2 g/dL (ref 13.0–17.0)
LYMPHS PCT: 33.5 % (ref 12.0–46.0)
Lymphs Abs: 1.7 10*3/uL (ref 0.7–4.0)
MCHC: 34.4 g/dL (ref 30.0–36.0)
MCV: 89.8 fl (ref 78.0–100.0)
MONOS PCT: 7.6 % (ref 3.0–12.0)
Monocytes Absolute: 0.4 10*3/uL (ref 0.1–1.0)
Neutro Abs: 2.7 10*3/uL (ref 1.4–7.7)
Neutrophils Relative %: 53.9 % (ref 43.0–77.0)
Platelets: 242 10*3/uL (ref 150.0–400.0)
RBC: 5.24 Mil/uL (ref 4.22–5.81)
RDW: 13.1 % (ref 11.5–15.5)
WBC: 5 10*3/uL (ref 4.0–10.5)

## 2015-05-31 LAB — COMPREHENSIVE METABOLIC PANEL
ALBUMIN: 4.7 g/dL (ref 3.5–5.2)
ALT: 18 U/L (ref 0–53)
AST: 19 U/L (ref 0–37)
Alkaline Phosphatase: 43 U/L (ref 39–117)
BUN: 11 mg/dL (ref 6–23)
CHLORIDE: 102 meq/L (ref 96–112)
CO2: 29 meq/L (ref 19–32)
CREATININE: 0.93 mg/dL (ref 0.40–1.50)
Calcium: 10 mg/dL (ref 8.4–10.5)
GFR: 87.02 mL/min (ref >60.00)
Glucose, Bld: 100 mg/dL — ABNORMAL HIGH (ref 70–99)
POTASSIUM: 4.3 meq/L (ref 3.5–5.1)
SODIUM: 139 meq/L (ref 135–145)
Total Bilirubin: 0.8 mg/dL (ref 0.2–1.2)
Total Protein: 7.4 g/dL (ref 6.0–8.3)

## 2015-05-31 LAB — TSH: TSH: 3.01 u[IU]/mL (ref 0.35–4.50)

## 2015-05-31 LAB — LIPID PANEL
CHOL/HDL RATIO: 4
Cholesterol: 211 mg/dL — ABNORMAL HIGH (ref 0–200)
HDL: 48.9 mg/dL (ref >39.00)
LDL CALC: 144 mg/dL — AB (ref 0–99)
NONHDL: 161.76
TRIGLYCERIDES: 87 mg/dL (ref 0.0–149.0)
VLDL: 17.4 mg/dL (ref 0.0–40.0)

## 2015-05-31 MED ORDER — PROPRANOLOL HCL 20 MG PO TABS: 20.0000 mg | ORAL_TABLET | Freq: Two times a day (BID) | ORAL | Status: DC

## 2015-05-31 NOTE — Assessment & Plan Note (Signed)
Well controlled Continue current dose of propranolol

## 2015-05-31 NOTE — Assessment & Plan Note (Signed)
Check lipid panel  

## 2015-05-31 NOTE — Patient Instructions (Addendum)
Monitor your blood pressure at home.  If it is consistently over 150/90 - either the top number or bottom number being elevated, please call so we can consider adjusting your medication.   Test(s) ordered today. Your results will be released to [place] (or called to you) after review, usually within 72hours after test completion. If any changes need to be made, you will be notified at that same time.  All other Health Maintenance issues reviewed.   All recommended immunizations and age-appropriate screenings are up-to-date or discussed.  No immunizations administered today.   Medications reviewed and updated.  No changes recommended at this time.  Your prescription(s) have been submitted to your pharmacy. Please take as directed and contact our office if you believe you are having problem(s) with the medication(s).   Please followup in one year, sooner if needed

## 2015-05-31 NOTE — Progress Notes (Signed)
Subjective:    Patient ID: Ricky Tate, male    DOB: 09-17-1950, 65 y.o.   MRN: [id number]  HPI He is here to establish with a new pcp.     Hypertension: He is taking his medication daily. He is compliant with a low sodium diet.  He denies chest pain, palpitations, edema, shortness of breath and regular headaches. He is exercising regularly.  He does monitor his blood pressure at home, but has not checked it recently.    Intention tremor;  He was initially placed on the propranolol for his tremor, but then they realized he probably needed it for his BP as well.  He feels his tremor is well controlled and he is happy with his current dose of propranolol.    Medications and allergies reviewed with patient and updated if appropriate.  Patient Active Problem List   Diagnosis Date Noted  . Psoriasis 10/11/2011  . HYPERLIPIDEMIA 01/31/2010  . Essential hypertension 01/31/2010  . SKIN CANCER, HX OF 01/31/2010  . PERSONAL HISTORY OF ALLERGY TO SEAFOOD 06/13/2009  . INTENTION TREMOR 10/26/2008  . TINNITUS, LEFT 10/26/2008  . COLONIC POLYPS, HX OF 10/26/2008    Current Outpatient Prescriptions on File Prior to Visit  Medication Sig Dispense Refill  . propranolol (INDERAL) 20 MG tablet Take 1 tablet (20 mg total) by mouth 2 (two) times daily. 180 tablet 0   No current facility-administered medications on file prior to visit.    Past Medical History  Diagnosis Date  . Hypertension   . Hyperplastic colon polyp     pmh  . Benign prostatic hypertrophy   . Cancer ([place])     skin,squamous cell..pmh of  . Gilbert's syndrome   . Hyperlipidemia     NMR:LDL 133(1391/570) HDL 77,TG 61  . Syncope     vaso vagal     Past Surgical History  Procedure Laterality Date  . Colonoscopy w/ polypectomy  2008  . Vasectomy    . Colonoscopy  05/2011     Dr Olevia Perches    Social History   Social History  . Marital Status: Married    Spouse Name: N/A  . Number of Children: N/A  . Years of  Education: N/A   Occupational History  . Teacher, [employer]    Social History Main Topics  . Smoking status: Never Smoker   . Smokeless tobacco: Never Used  . Alcohol Use: Yes     Comment: socially  . Drug Use: No  . Sexual Activity: Not Asked   Other Topics Concern  . None   Social History Narrative   Regular exercise    Family History  Problem Relation Age of Onset  . Alzheimer's disease Mother   . Colon cancer Mother 79  . Lung cancer Father     smoker  . Alzheimer's disease Maternal Aunt     x2 aunts.x 2  . Alzheimer's disease Maternal Uncle     x 2  . Heart disease Paternal Uncle     CABG,CAD  . Diabetes Paternal Uncle   . Hypertension Maternal Grandmother   . Hypertension Maternal Grandfather   . Stroke Maternal Grandfather 64  . Hypertension Paternal Grandmother   . Stroke Paternal Grandmother     in 86s  . Hypertension Paternal Grandfather   . Stroke Paternal Grandfather     late 65s  . Schizophrenia      aunt  . Stomach cancer Neg Hx   . Diabetes Paternal Aunt   .  Heart attack Neg Hx     Review of Systems  Constitutional: Negative for fever and chills.  Respiratory: Negative for cough, shortness of breath and wheezing.   Cardiovascular: Negative for chest pain, palpitations and leg swelling.  Gastrointestinal: Negative for nausea and abdominal pain.       Occ gerd  Neurological: Negative for dizziness, light-headedness and headaches.       Objective:   Filed Vitals:   05/31/15 1108  BP: 142/94  Pulse: 56  Temp: 98 F (36.7 C)  Resp: 16   Filed Weights   05/31/15 1108  Weight: 188 lb (85.276 kg)   Body mass index is 26.98 kg/(m^2).   Physical Exam Constitutional: Appears well-developed and well-nourished. No distress.  Neck: Neck supple. No tracheal deviation present. No thyromegaly present.  No carotid bruit. No cervical adenopathy.   Cardiovascular: Normal rate, regular rhythm and normal heart sounds.   No murmur heard.   No edema Pulmonary/Chest: Effort normal and breath sounds normal. No respiratory distress. No wheezes.       Assessment & Plan:   See Problem List for Assessment and Plan of chronic medical problems.  Follow up annually

## 2015-05-31 NOTE — Progress Notes (Signed)
Pre visit review using our clinic review tool, if applicable. No additional management support is needed unless otherwise documented below in the visit note. 

## 2015-05-31 NOTE — Assessment & Plan Note (Signed)
BP slightly elevated here today He will start checking his BP at home regularly - goal is less than 150/90 on average  He will work on weight loss Continue regular exercise (5 days per week) If BP is elevated he will call so we can adjust medication - possibly add low dose norvasc Continue current dose of propranolol Cmp, tsh, cbc, lipid

## 2015-06-04 ENCOUNTER — Encounter: Payer: Self-pay | Admitting: Internal Medicine

## 2015-07-11 ENCOUNTER — Other Ambulatory Visit: Payer: Self-pay | Admitting: Internal Medicine

## 2016-01-23 HISTORY — PX: COLONOSCOPY: SHX174

## 2016-04-19 ENCOUNTER — Encounter: Payer: Self-pay | Admitting: Gastroenterology

## 2016-05-26 ENCOUNTER — Other Ambulatory Visit: Payer: Self-pay | Admitting: Internal Medicine

## 2016-09-11 ENCOUNTER — Telehealth: Payer: Self-pay | Admitting: Internal Medicine

## 2016-09-13 ENCOUNTER — Other Ambulatory Visit: Payer: Self-pay | Admitting: Internal Medicine

## 2016-09-13 MED ORDER — PROPRANOLOL HCL 20 MG PO TABS: 20.0000 mg | ORAL_TABLET | Freq: Two times a day (BID) | ORAL | 0 refills | Status: DC

## 2016-09-13 NOTE — Telephone Encounter (Signed)
Patient has an appointment for 8/28, can he get a refill until then?

## 2016-09-13 NOTE — Telephone Encounter (Signed)
Per office policy sent 5 day supply to pharmacy until appt...Ricky Tate

## 2016-09-13 NOTE — Addendum Note (Signed)
Addended by: Earnstine Regal on: 09/13/2016 02:15 PM   Modules accepted: Orders

## 2016-09-17 NOTE — Progress Notes (Signed)
Subjective:    Patient ID: Ricky Tate, male    DOB: [birth year], 66 y.o.   MRN: [id number]  HPI  Here for a welcome to medicare wellness exam and follow up of his chronic medical problems.    Hypertension: He is taking his medication daily. He is compliant with a low sodium diet.  He denies chest pain, palpitations, edema, shortness of breath and regular headaches. He is exercising regularly - walking daily, some yoga.  He does monitor his blood pressure at home, but not recently.   Earlier this year it was around 135/80 on average.   Intention tremor:  He is taking propranolol twice daily.  This is controlling his tremor well and he is happy with his current medication dose.   Hyperlipidemia: He is not on any medication and would ideally like to avoid medication. He is compliant with a low fat/cholesterol diet. He is exercising regularly.   I have personally reviewed and have noted 1.The patient's medical and social history 2.Their use of alcohol, tobacco or illicit drugs 3.Their current medications and supplements 4.The patient's functional ability including ADL's, fall risks, home                 safety risk and hearing or visual impairment. 5.Diet and physical activities 6.Evidence for depression or mood disorders 7.Care team reviewed  -  Eye doctor, dentist   Are there smokers in your home (other than you)? No  Risk Factors Exercise:  Walks daily -rigorously, yoga a couple of times a week, plays golf a couple of time a month  Dietary issues discussed:   Limits sodium and sugars, has not been as compliant with limiting food intake at times and has gained weight; typically eats healthy  Cardiac risk factors: advanced age, hypertension, hyperlipidemia  Depression Screen  Have you felt down, depressed or hopeless? No  Have you felt little interest or pleasure in doing things?  No  Activities of Daily  Living In your present state of health, do you have any difficulty performing the following activities?:  Driving? No Managing money?  No Feeding yourself? No Getting from bed to chair? No Climbing a flight of stairs? No Preparing food and eating?: No Bathing or showering? No Getting dressed: No Getting to/using the toilet? No Moving around from place to place: No In the past year have you fallen or had a near fall?: No   Do you have more than one partner?  no  Hearing Difficulties: No Do you often ask people to speak up or repeat themselves? No Do you experience ringing or noises in your ears? Yes  Do you have difficulty understanding soft or whispered voices? No Vision:              Any change in vision:  Only aging changes             Up to date with eye exam:   Yes, has appt today  Memory:  Do you feel that you have a problem with memory? No  Do you often misplace items? No  Do you feel safe at home?  Yes  Cognitive Testing  Alert, Orientated? Yes  Normal Appearance? Yes  Recall of three objects?  Yes  Can perform simple calculations? Yes  Displays appropriate judgment? Yes  Can read the correct time from a watch face? Yes   Advanced Directives have been discussed with the patient? Yes - in place     Medications and allergies  reviewed with patient and updated if appropriate.  Patient Active Problem List   Diagnosis Date Noted  . Psoriasis 10/11/2011  . Hyperlipidemia 01/31/2010  . Essential hypertension 01/31/2010  . SKIN CANCER, HX OF 01/31/2010  . PERSONAL HISTORY OF ALLERGY TO SEAFOOD 06/13/2009  . Intention tremor 10/26/2008  . TINNITUS, LEFT 10/26/2008  . COLONIC POLYPS, HX OF 10/26/2008    No current outpatient prescriptions on file prior to visit.   No current facility-administered medications on file prior to visit.     Past Medical History:  Diagnosis Date  . Benign prostatic hypertrophy   . Cancer ([place])    skin,squamous cell..pmh of  .  Gilbert's syndrome   . Hyperlipidemia    NMR:LDL 133(1391/570) HDL 77,TG 61  . Hyperplastic colon polyp    pmh  . Hypertension   . Syncope    vaso vagal     Past Surgical History:  Procedure Laterality Date  . COLONOSCOPY  05/2011    Dr Olevia Perches  . COLONOSCOPY W/ POLYPECTOMY  2008  . VASECTOMY      Social History   Social History  . Marital status: Married    Spouse name: N/A  . Number of children: N/A  . Years of education: N/A   Occupational History  . Curator   Social History Main Topics  . Smoking status: Never Smoker  . Smokeless tobacco: Never Used  . Alcohol use Yes     Comment: socially  . Drug use: No  . Sexual activity: Not Asked   Other Topics Concern  . None   Social History Narrative   Regular exercise    Family History  Problem Relation Age of Onset  . Alzheimer's disease Mother   . Colon cancer Mother 59  . Lung cancer Father        smoker  . Alzheimer's disease Maternal Aunt        x2 aunts.x 2  . Alzheimer's disease Maternal Uncle        x 2  . Heart disease Paternal Uncle        CABG,CAD  . Diabetes Paternal Uncle   . Hypertension Maternal Grandmother   . Hypertension Maternal Grandfather   . Stroke Maternal Grandfather 101  . Hypertension Paternal Grandmother   . Stroke Paternal Grandmother        in 68s  . Hypertension Paternal Grandfather   . Stroke Paternal Grandfather        late 83s  . Schizophrenia Unknown        aunt  . Diabetes Paternal Aunt   . Stomach cancer Neg Hx   . Heart attack Neg Hx     Review of Systems  Constitutional: Negative for chills and fever.  HENT: Positive for tinnitus. Negative for hearing loss.   Eyes: Negative for visual disturbance.  Respiratory: Negative for cough, shortness of breath and wheezing.   Cardiovascular: Negative for chest pain, palpitations and leg swelling.  Gastrointestinal: Negative for abdominal pain and nausea.       Rare gerd    Neurological: Negative for light-headedness and headaches.  Psychiatric/Behavioral: Negative for dysphoric mood. The patient is nervous/anxious (mild with upcoming retirement).        Objective:   Vitals:   09/18/16 1117  BP: (!) 158/94  Pulse: 61  Resp: 16  Temp: 97.9 F (36.6 C)  SpO2: 98%   Wt Readings from Last 3 Encounters:  09/18/16 191 lb (86.6 kg)  05/31/15 188 lb (85.3 kg)  10/18/14 187 lb (84.8 kg)   Body mass index is 27.41 kg/m.   Physical Exam    Constitutional: He appears well-developed and well-nourished. No distress.  HENT:  Head: Normocephalic and atraumatic.  Right Ear: External ear normal.  Left Ear: External ear normal.  Mouth/Throat: Oropharynx is clear and moist.  Normal ear canals and TM b/l  Eyes: Conjunctivae and EOM are normal.  Neck: Neck supple. No tracheal deviation present. No thyromegaly present.  No carotid bruit  Cardiovascular: Normal rate, regular rhythm, normal heart sounds and intact distal pulses.   No murmur heard. Pulmonary/Chest: Effort normal and breath sounds normal. No respiratory distress. He has no wheezes. He has no rales.  Abdominal: Soft. He exhibits no distension. There is no tenderness.  Genitourinary: deferred  Musculoskeletal: He exhibits no edema.  Lymphadenopathy:   He has no cervical adenopathy.  Skin: Skin is warm and dry. He is not diaphoretic.  Psychiatric: He has a normal mood and affect. His behavior is normal.     Visual Acuity Screening   Right eye Left eye Both eyes  Without correction: 20/40 20/70 20/25   With correction:          Assessment & Plan:   Wellness Exam: Immunizations  Due for prevnar and flu - will get at work Colonoscopy  Due - will refer so they contact him Eye exam  Has exam today EKG: sinus rhythm at 58 bpm, normal EKG, unchanged from 2012 Hearing loss    none Memory concerns/difficulties    none Independent of ADLs  fully Stressed the importance of regular exercise,  which he is doing   Patient received copy of preventative screening tests/immunizations recommended for the next 5-10 years.   See Problem List for Assessment and Plan of chronic medical problems.

## 2016-09-18 ENCOUNTER — Other Ambulatory Visit (INDEPENDENT_AMBULATORY_CARE_PROVIDER_SITE_OTHER): Payer: Medicare Other

## 2016-09-18 ENCOUNTER — Encounter: Payer: Self-pay | Admitting: Internal Medicine

## 2016-09-18 ENCOUNTER — Ambulatory Visit (INDEPENDENT_AMBULATORY_CARE_PROVIDER_SITE_OTHER): Payer: Medicare Other | Admitting: Internal Medicine

## 2016-09-18 VITALS — BP 158/94 | HR 61 | Temp 97.9°F | Resp 16 | Ht 70.0 in | Wt 191.0 lb

## 2016-09-18 DIAGNOSIS — E7800 Pure hypercholesterolemia, unspecified: Secondary | ICD-10-CM

## 2016-09-18 DIAGNOSIS — R739 Hyperglycemia, unspecified: Secondary | ICD-10-CM

## 2016-09-18 DIAGNOSIS — Z0000 Encounter for general adult medical examination without abnormal findings: Secondary | ICD-10-CM

## 2016-09-18 DIAGNOSIS — I10 Essential (primary) hypertension: Secondary | ICD-10-CM

## 2016-09-18 DIAGNOSIS — G252 Other specified forms of tremor: Secondary | ICD-10-CM

## 2016-09-18 DIAGNOSIS — Z1211 Encounter for screening for malignant neoplasm of colon: Secondary | ICD-10-CM | POA: Diagnosis not present

## 2016-09-18 DIAGNOSIS — Z125 Encounter for screening for malignant neoplasm of prostate: Secondary | ICD-10-CM | POA: Diagnosis not present

## 2016-09-18 LAB — CBC WITH DIFFERENTIAL/PLATELET
Basophils Absolute: 0 10*3/uL (ref 0.0–0.1)
Basophils Relative: 0.5 % (ref 0.0–3.0)
EOS PCT: 3.7 % (ref 0.0–5.0)
Eosinophils Absolute: 0.2 10*3/uL (ref 0.0–0.7)
HEMATOCRIT: 45.7 % (ref 39.0–52.0)
HEMOGLOBIN: 15.6 g/dL (ref 13.0–17.0)
Lymphocytes Relative: 36.3 % (ref 12.0–46.0)
Lymphs Abs: 1.5 10*3/uL (ref 0.7–4.0)
MCHC: 34 g/dL (ref 30.0–36.0)
MCV: 94.6 fl (ref 78.0–100.0)
MONO ABS: 0.6 10*3/uL (ref 0.1–1.0)
Monocytes Relative: 13.1 % — ABNORMAL HIGH (ref 3.0–12.0)
Neutro Abs: 2 10*3/uL (ref 1.4–7.7)
Neutrophils Relative %: 46.4 % (ref 43.0–77.0)
Platelets: 193 10*3/uL (ref 150.0–400.0)
RBC: 4.83 Mil/uL (ref 4.22–5.81)
RDW: 13.1 % (ref 11.5–15.5)
WBC: 4.2 10*3/uL (ref 4.0–10.5)

## 2016-09-18 LAB — COMPREHENSIVE METABOLIC PANEL
ALBUMIN: 4.6 g/dL (ref 3.5–5.2)
ALK PHOS: 47 U/L (ref 39–117)
ALT: 20 U/L (ref 0–53)
AST: 20 U/L (ref 0–37)
BUN: 10 mg/dL (ref 6–23)
CALCIUM: 9.6 mg/dL (ref 8.4–10.5)
CHLORIDE: 102 meq/L (ref 96–112)
CO2: 32 mEq/L (ref 19–32)
Creatinine, Ser: 0.87 mg/dL (ref 0.40–1.50)
GFR: 93.59 mL/min (ref >60.00)
Glucose, Bld: 112 mg/dL — ABNORMAL HIGH (ref 70–99)
POTASSIUM: 4.2 meq/L (ref 3.5–5.1)
SODIUM: 138 meq/L (ref 135–145)
Total Bilirubin: 0.9 mg/dL (ref 0.2–1.2)
Total Protein: 7.2 g/dL (ref 6.0–8.3)

## 2016-09-18 LAB — LIPID PANEL
CHOL/HDL RATIO: 4
Cholesterol: 200 mg/dL (ref 0–200)
HDL: 50 mg/dL (ref >39.00)
LDL Cholesterol: 131 mg/dL — ABNORMAL HIGH (ref 0–99)
NONHDL: 149.94
Triglycerides: 94 mg/dL (ref 0.0–149.0)
VLDL: 18.8 mg/dL (ref 0.0–40.0)

## 2016-09-18 LAB — PSA, MEDICARE: PSA: 0.79 ng/ml (ref 0.10–4.00)

## 2016-09-18 LAB — TSH: TSH: 2.46 u[IU]/mL (ref 0.35–4.50)

## 2016-09-18 LAB — HEMOGLOBIN A1C: HEMOGLOBIN A1C: 5.3 % (ref 4.6–6.5)

## 2016-09-18 MED ORDER — PROPRANOLOL HCL 20 MG PO TABS: 20.0000 mg | ORAL_TABLET | Freq: Two times a day (BID) | ORAL | 3 refills | Status: DC

## 2016-09-18 NOTE — Assessment & Plan Note (Signed)
Controlled, stable Continue current dose of medication  

## 2016-09-18 NOTE — Assessment & Plan Note (Signed)
Check lipids He would like to avoid medication if possible  Will calculate ASCVD risk  Continue regular exercise Work on weight loss

## 2016-09-18 NOTE — Assessment & Plan Note (Signed)
BP elevated here today, has not checked it at home Discussed goal of < 140/90 He will check daily for the next couple of week or so and let me know what he gets Continue regular exercise Low sodium diet Will work on weight loss Will need to add a second medication if needed, possibly lisinopril cmp,tsh

## 2016-09-18 NOTE — Patient Instructions (Addendum)
  Mr. Din , Thank you for taking time to come for your Medicare Wellness Visit. I appreciate your ongoing commitment to your health goals. Please review the following plan we discussed and let me know if I can assist you in the future.   These are the goals we discussed: Goals    Monitor BP - goal is less than 140/90      This is a list of the screening recommended for you and due dates:  Health Maintenance  Topic Date Due  . Colon Cancer Screening  06/14/2016  . Flu Shot  08/22/2016  . Pneumonia vaccines (1 of 2 - PCV13) 09/05/2016  . Tetanus Vaccine  02/01/2020  .  Hepatitis C: One time screening is recommended by Center for Disease Control  (CDC) for  adults born from [birth year] through 1964.   Completed  . HIV Screening  Completed    Test(s) ordered today. Your results will be released to [facility] (or called to you) after review, usually within 72hours after test completion. If any changes need to be made, you will be notified at that same time.  All other Health Maintenance issues reviewed.   All recommended immunizations and age-appropriate screenings are up-to-date or discussed.  No immunizations administered today. An EKG was done today.   Medications reviewed and updated.  No changes recommended at this time.  Your prescription(s) have been submitted to your pharmacy. Please take as directed and contact our office if you believe you are having problem(s) with the medication(s).   Please followup in one year, sooner if needed

## 2016-09-20 ENCOUNTER — Encounter: Payer: Self-pay | Admitting: Internal Medicine

## 2016-09-21 ENCOUNTER — Encounter: Payer: Self-pay | Admitting: Gastroenterology

## 2016-11-07 ENCOUNTER — Ambulatory Visit (AMBULATORY_SURGERY_CENTER): Payer: Self-pay | Admitting: *Deleted

## 2016-11-07 VITALS — Ht 69.0 in | Wt 195.0 lb

## 2016-11-07 DIAGNOSIS — Z800 Family history of malignant neoplasm of digestive organs: Secondary | ICD-10-CM

## 2016-11-07 MED ORDER — SUPREP BOWEL PREP KIT 17.5-3.13-1.6 GM/177ML PO SOLN: 1.0000 | Freq: Once | ORAL | 0 refills | Status: AC

## 2016-11-07 NOTE — Progress Notes (Signed)
Patient denies any allergies to egg or soy products. Patient denies complications with anesthesia/sedation.  Patient denies oxygen use at home and denies diet medications. Patient denies information on colonoscopy. 

## 2016-11-21 ENCOUNTER — Encounter: Payer: Self-pay | Admitting: Gastroenterology

## 2016-11-21 ENCOUNTER — Ambulatory Visit (AMBULATORY_SURGERY_CENTER): Payer: 59 | Admitting: Gastroenterology

## 2016-11-21 VITALS — BP 114/74 | HR 67 | Temp 96.8°F | Resp 13 | Ht 69.0 in | Wt 195.0 lb

## 2016-11-21 DIAGNOSIS — Z800 Family history of malignant neoplasm of digestive organs: Secondary | ICD-10-CM

## 2016-11-21 DIAGNOSIS — D122 Benign neoplasm of ascending colon: Secondary | ICD-10-CM | POA: Diagnosis not present

## 2016-11-21 DIAGNOSIS — D120 Benign neoplasm of cecum: Secondary | ICD-10-CM | POA: Diagnosis not present

## 2016-11-21 DIAGNOSIS — D127 Benign neoplasm of rectosigmoid junction: Secondary | ICD-10-CM

## 2016-11-21 DIAGNOSIS — Z1211 Encounter for screening for malignant neoplasm of colon: Secondary | ICD-10-CM

## 2016-11-21 DIAGNOSIS — Z1212 Encounter for screening for malignant neoplasm of rectum: Secondary | ICD-10-CM | POA: Diagnosis not present

## 2016-11-21 MED ORDER — SODIUM CHLORIDE 0.9 % IV SOLN: 500.0000 mL | INTRAVENOUS | Status: DC

## 2016-11-21 NOTE — Progress Notes (Signed)
A and O x3. Report to RN. Tolerated MAC anesthesia well.

## 2016-11-21 NOTE — Progress Notes (Signed)
Pt's states no medical or surgical changes since previsit or office visit. 

## 2016-11-21 NOTE — Progress Notes (Signed)
Called to room to assist during endoscopic procedure.  Patient ID and intended procedure confirmed with present staff. Received instructions for my participation in the procedure from the performing physician.  

## 2016-11-21 NOTE — Op Note (Signed)
[facility] Patient Name: Ricky Tate Procedure Date: 11/21/2016 8:26 AM MRN: [id number] Endoscopist: Remo Lipps P. Zuri Lascala MD, MD Age: 66 Referring MD:  Date of Birth: 17-Jan-1951 Gender: Male Account #: [id number] Procedure:                Colonoscopy Indications:              Screening in patient at increased risk: Family                            history of 1st-degree relative with colorectal                            cancer before age 63 years Medicines:                Monitored Anesthesia Care Procedure:                Pre-Anesthesia Assessment:                           - Prior to the procedure, a History and Physical                            was performed, and patient medications and                            allergies were reviewed. The patient's tolerance of                            previous anesthesia was also reviewed. The risks                            and benefits of the procedure and the sedation                            options and risks were discussed with the patient.                            All questions were answered, and informed consent                            was obtained. Prior Anticoagulants: The patient has                            taken no previous anticoagulant or antiplatelet                            agents. ASA Grade Assessment: II - A patient with                            mild systemic disease. After reviewing the risks                            and benefits, the patient was deemed in  satisfactory condition to undergo the procedure.                           After obtaining informed consent, the colonoscope                            was passed under direct vision. Throughout the                            procedure, the patient's blood pressure, pulse, and                            oxygen saturations were monitored continuously. The                            Colonoscope was introduced  through the anus and                            advanced to the the terminal ileum, with                            identification of the appendiceal orifice and IC                            valve. The colonoscopy was performed without                            difficulty. The patient tolerated the procedure                            well. The quality of the bowel preparation was                            good. The terminal ileum, ileocecal valve,                            appendiceal orifice, and rectum were photographed. Scope In: 8:40:24 AM Scope Out: 9:04:57 AM Scope Withdrawal Time: 0 hours 22 minutes 2 seconds  Total Procedure Duration: 0 hours 24 minutes 33 seconds  Findings:                 The perianal and digital rectal examinations were                            normal.                           The terminal ileum appeared normal.                           A 7 mm polyp was found in the cecum. The polyp was                            sessile. The polyp was removed with a cold snare.  Resection and retrieval were complete.                           A 5 mm polyp was found in the ascending colon. The                            polyp was sessile. The polyp was removed with a                            cold snare. Resection and retrieval were complete.                           A 6 mm polyp was found in the recto-sigmoid colon.                            The polyp was pedunculated. The polyp was removed                            with a hot snare. Resection and retrieval were                            complete. Unfortunately despite use of cautery                            there was persistent bleeding at the polypectomy                            site. It was treated with snare tip coagulation but                            bleeding persisted. For hemostasis, one hemostatic                            clip was successfully placed. There was no  bleeding                            at the end of the procedure.                           A 8 mm polyp was found in the recto-sigmoid colon.                            The polyp was pedunculated. The polyp was removed                            with a hot snare. Resection and retrieval were                            complete.                           A few small-mouthed diverticula were found in the  sigmoid colon.                           Internal hemorrhoids were found during retroflexion.                           The exam was otherwise without abnormality. Complications:            No immediate complications. Estimated blood loss:                            Minimal. Estimated Blood Loss:     Estimated blood loss was minimal. Impression:               - The examined portion of the ileum was normal.                           - One 7 mm polyp in the cecum, removed with a cold                            snare. Resected and retrieved.                           - One 5 mm polyp in the ascending colon, removed                            with a cold snare. Resected and retrieved.                           - One 6 mm polyp at the recto-sigmoid colon,                            removed with a hot snare. Resected and retrieved.                            Clip was placed.                           - One 8 mm polyp at the recto-sigmoid colon,                            removed with a hot snare. Resected and retrieved.                           - Diverticulosis in the sigmoid colon.                           - Internal hemorrhoids.                           - The examination was otherwise normal. Recommendation:           - Patient has a contact number available for                            emergencies. The signs and  symptoms of potential                            delayed complications were discussed with the                            patient. Return to normal  activities tomorrow.                            Written discharge instructions were provided to the                            patient.                           - Resume previous diet.                           - Continue present medications.                           - Await pathology results.                           - Repeat colonoscopy is recommended for                            surveillance. The colonoscopy date will be                            determined after pathology results from today's                            exam become available for review.                           - No ibuprofen, naproxen, or other non-steroidal                            anti-inflammatory drugs for 2 weeks after polyp                            removal. Remo Lipps P. Paz Winsett MD, MD 11/21/2016 9:10:59 AM This report has been signed electronically.

## 2016-11-21 NOTE — Patient Instructions (Signed)
**  Handouts given on polyps, hemorrhoids, and diverticulosis**   YOU HAD AN ENDOSCOPIC PROCEDURE TODAY: Refer to the procedure report and other information in the discharge instructions given to you for any specific questions about what was found during the examination. If this information does not answer your questions, please call [place] office at [phone] to clarify.   YOU SHOULD EXPECT: Some feelings of bloating in the abdomen. Passage of more gas than usual. Walking can help get rid of the air that was put into your GI tract during the procedure and reduce the bloating. If you had a lower endoscopy (such as a colonoscopy or flexible sigmoidoscopy) you may notice spotting of blood in your stool or on the toilet paper. Some abdominal soreness may be present for a day or two, also.  DIET: Your first meal following the procedure should be a light meal and then it is ok to progress to your normal diet. A half-sandwich or bowl of soup is an example of a good first meal. Heavy or fried foods are harder to digest and may make you feel nauseous or bloated. Drink plenty of fluids but you should avoid alcoholic beverages for 24 hours. If you had a esophageal dilation, please see attached instructions for diet.    ACTIVITY: Your care partner should take you home directly after the procedure. You should plan to take it easy, moving slowly for the rest of the day. You can resume normal activity the day after the procedure however YOU SHOULD NOT DRIVE, use power tools, machinery or perform tasks that involve climbing or major physical exertion for 24 hours (because of the sedation medicines used during the test).   SYMPTOMS TO REPORT IMMEDIATELY: A gastroenterologist can be reached at any hour. Please call [phone]  for any of the following symptoms:  Following lower endoscopy (colonoscopy, flexible sigmoidoscopy) Excessive amounts of blood in the stool  Significant tenderness, worsening of abdominal  pains  Swelling of the abdomen that is new, acute  Fever of 100 or higher    FOLLOW UP:  If any biopsies were taken you will be contacted by phone or by letter within the next 1-3 weeks. Call [phone]  if you have not heard about the biopsies in 3 weeks.  Please also call with any specific questions about appointments or follow up tests.

## 2016-11-22 ENCOUNTER — Telehealth: Payer: Self-pay

## 2016-11-22 NOTE — Telephone Encounter (Signed)
  Follow up Call-  Call back number 11/21/2016  Post procedure Call Back phone  # [phone]  Permission to leave phone message Yes  Some recent data might be hidden     Patient questions:  Do you have a fever, pain , or abdominal swelling? No. Pain Score  0 *  Have you tolerated food without any problems? Yes.    Have you been able to return to your normal activities? Yes.    Do you have any questions about your discharge instructions: Diet   No. Medications  No. Follow up visit  No.  Do you have questions or concerns about your Care? No.  Actions: * If pain score is 4 or above: No action needed, pain <4.  No problems noted per pt. maw

## 2016-11-26 ENCOUNTER — Encounter: Payer: Self-pay | Admitting: Gastroenterology

## 2017-02-08 ENCOUNTER — Ambulatory Visit: Payer: Self-pay | Admitting: *Deleted

## 2017-02-08 NOTE — Telephone Encounter (Signed)
Pt reports nausea x 3 days. 3 episodes of vomiting, last episode yesterday. Reports nausea mild today. Drinking Gatorade and able to keep down. Reports urinating normally, no dizziness. States wife has also had mild nausea.  Home care advice given per protocol and instructed pt to call back if symptoms worsen. Reviewed S/S dehydration.  Reason for Disposition . Unexplained nausea  Answer Assessment - Initial Assessment Questions 1. NAUSEA SEVERITY: "How bad is the nausea?" (e.g., mild, moderate, severe; dehydration, weight loss)   - MILD: loss of appetite without change in eating habits   - MODERATE: decreased oral intake without significant weight loss, dehydration, or malnutrition   - SEVERE: inadequate caloric or fluid intake, significant weight loss, symptoms of dehydration     mild 2. ONSET: "When did the nausea begin?"     02/06/17 3. VOMITING: "Any vomiting?" If so, ask: "How many times today?"     Last episode was yesterday, 3 episodes 4. RECURRENT SYMPTOM: "Have you had nausea before?" If so, ask: "When was the last time?" "What happened that time?"    Maybe, long time ago.  5. CAUSE: "What do you think is causing the nausea?"     Unsure  Protocols used: NAUSEA-A-AH

## 2017-10-05 ENCOUNTER — Other Ambulatory Visit: Payer: Self-pay | Admitting: Internal Medicine

## 2017-10-07 ENCOUNTER — Other Ambulatory Visit: Payer: Self-pay | Admitting: Internal Medicine

## 2017-11-08 ENCOUNTER — Other Ambulatory Visit: Payer: Self-pay | Admitting: Internal Medicine

## 2017-11-11 ENCOUNTER — Other Ambulatory Visit: Payer: Self-pay | Admitting: Internal Medicine

## 2018-01-12 ENCOUNTER — Other Ambulatory Visit: Payer: Self-pay | Admitting: Internal Medicine

## 2018-02-14 ENCOUNTER — Other Ambulatory Visit: Payer: Self-pay | Admitting: Internal Medicine

## 2018-02-17 NOTE — Progress Notes (Signed)
Subjective:    Patient ID: Ricky Tate, male    DOB: Jan 12, 1951, 68 y.o.   MRN: [id number]  HPI The patient is here for follow up.  Intention tremor:  He is taking propranolol daily as prescribed.  He feels his tremor is well controlled.  Hypertension: He is taking his medication daily. He is compliant with a low sodium diet.  He denies chest pain, palpitations, edema, shortness of breath and regular headaches. He is exercising regularly.     Hyperlipidemia: He has had mildly elevated cholesterol in the past.  He is exercising regularly and eating healthy.  He wonders what his cholesterol is now.  He has lost 20 lbs and has kept it off.  He is exercising regularly and is avoiding sugar.  Medications and allergies reviewed with patient and updated if appropriate.  Patient Active Problem List   Diagnosis Date Noted  . Psoriasis 10/11/2011  . Hyperlipidemia 01/31/2010  . Essential hypertension 01/31/2010  . SKIN CANCER, HX OF 01/31/2010  . PERSONAL HISTORY OF ALLERGY TO SEAFOOD 06/13/2009  . Intention tremor 10/26/2008  . TINNITUS, LEFT 10/26/2008  . COLONIC POLYPS, HX OF 10/26/2008    Current Outpatient Medications on File Prior to Visit  Medication Sig Dispense Refill  . propranolol (INDERAL) 20 MG tablet Take 1 tablet (20 mg) by mouth twice daily. Needs office visit for more refills 60 tablet 0   No current facility-administered medications on file prior to visit.     Past Medical History:  Diagnosis Date  . Benign prostatic hypertrophy   . Cancer ([place])    skin,squamous cell..[facility] of  . Cataract    bialteral - just watching cataracts  . Gilbert's syndrome   . Hyperlipidemia    NMR:LDL 133(1391/570) HDL 77,TG 61 - diet controlled  . Hyperplastic colon polyp    pmh  . Hypertension   . Syncope    vaso vagal     Past Surgical History:  Procedure Laterality Date  . COLONOSCOPY  05/2011    Dr Olevia Perches  . COLONOSCOPY W/ POLYPECTOMY  2008  . VASECTOMY       Social History   Socioeconomic History  . Marital status: Married    Spouse name: Not on file  . Number of children: Not on file  . Years of education: Not on file  . Highest education level: Not on file  Occupational History  . Occupation: Chief Executive Officer: [employer]  Social Needs  . Financial resource strain: Not on file  . Food insecurity:    Worry: Not on file    Inability: Not on file  . Transportation needs:    Medical: Not on file    Non-medical: Not on file  Tobacco Use  . Smoking status: Never Smoker  . Smokeless tobacco: Never Used  Substance and Sexual Activity  . Alcohol use: Yes    Alcohol/week: 14.0 standard drinks    Types: 14 Glasses of wine per week    Comment: 2 drinks per day  . Drug use: No  . Sexual activity: Not on file  Lifestyle  . Physical activity:    Days per week: Not on file    Minutes per session: Not on file  . Stress: Not on file  Relationships  . Social connections:    Talks on phone: Not on file    Gets together: Not on file    Attends religious service: Not on file  Active member of club or organization: Not on file    Attends meetings of clubs or organizations: Not on file    Relationship status: Not on file  Other Topics Concern  . Not on file  Social History Narrative   Regular exercise    Family History  Problem Relation Age of Onset  . Alzheimer's disease Mother   . Colon cancer Mother 73  . Lung cancer Father        smoker  . Alzheimer's disease Maternal Aunt        x2 aunts.x 2  . Alzheimer's disease Maternal Uncle        x 2  . Heart disease Paternal Uncle        CABG,CAD  . Diabetes Paternal Uncle   . Hypertension Maternal Grandmother   . Hypertension Maternal Grandfather   . Stroke Maternal Grandfather 82  . Hypertension Paternal Grandmother   . Stroke Paternal Grandmother        in 51s  . Hypertension Paternal Grandfather   . Stroke Paternal Grandfather         late 66s  . Schizophrenia Unknown        aunt  . Diabetes Paternal Aunt   . Stomach cancer Neg Hx   . Heart attack Neg Hx   . Colon polyps Neg Hx   . Rectal cancer Neg Hx     Review of Systems  Constitutional: Negative for chills and fever.  Respiratory: Negative for cough, shortness of breath and wheezing.   Cardiovascular: Negative for chest pain, palpitations and leg swelling.  Neurological: Negative for light-headedness and headaches.       Objective:   Vitals:   02/18/18 0948  BP: 124/82  Pulse: (!) 58  Resp: 14  Temp: 98.6 F (37 C)  SpO2: 99%   BP Readings from Last 3 Encounters:  02/18/18 124/82  11/21/16 114/74  09/18/16 (!) 158/94   Wt Readings from Last 3 Encounters:  02/18/18 177 lb (80.3 kg)  11/21/16 195 lb (88.5 kg)  11/07/16 195 lb (88.5 kg)   Body mass index is 26.14 kg/m.   Physical Exam    Constitutional: Appears well-developed and well-nourished. No distress.  HENT:  Head: Normocephalic and atraumatic.  Neck: Neck supple. No tracheal deviation present. No thyromegaly present.  No cervical lymphadenopathy Cardiovascular: Normal rate, regular rhythm and normal heart sounds.   No murmur heard. No carotid bruit .  No edema Pulmonary/Chest: Effort normal and breath sounds normal. No respiratory distress. No has no wheezes. No rales.  Skin: Skin is warm and dry. Not diaphoretic.  Psychiatric: Normal mood and affect. Behavior is normal.      Assessment & Plan:    See Problem List for Assessment and Plan of chronic medical problems.

## 2018-02-18 ENCOUNTER — Other Ambulatory Visit (INDEPENDENT_AMBULATORY_CARE_PROVIDER_SITE_OTHER): Payer: Medicare Other

## 2018-02-18 ENCOUNTER — Encounter: Payer: Self-pay | Admitting: Internal Medicine

## 2018-02-18 ENCOUNTER — Ambulatory Visit (INDEPENDENT_AMBULATORY_CARE_PROVIDER_SITE_OTHER): Payer: Medicare Other | Admitting: Internal Medicine

## 2018-02-18 VITALS — BP 124/82 | HR 58 | Temp 98.6°F | Resp 14 | Ht 69.0 in | Wt 177.0 lb

## 2018-02-18 DIAGNOSIS — Z125 Encounter for screening for malignant neoplasm of prostate: Secondary | ICD-10-CM

## 2018-02-18 DIAGNOSIS — G252 Other specified forms of tremor: Secondary | ICD-10-CM

## 2018-02-18 DIAGNOSIS — Z23 Encounter for immunization: Secondary | ICD-10-CM | POA: Diagnosis not present

## 2018-02-18 DIAGNOSIS — R739 Hyperglycemia, unspecified: Secondary | ICD-10-CM

## 2018-02-18 DIAGNOSIS — E782 Mixed hyperlipidemia: Secondary | ICD-10-CM

## 2018-02-18 DIAGNOSIS — I10 Essential (primary) hypertension: Secondary | ICD-10-CM

## 2018-02-18 DIAGNOSIS — R7303 Prediabetes: Secondary | ICD-10-CM | POA: Insufficient documentation

## 2018-02-18 LAB — COMPREHENSIVE METABOLIC PANEL
ALT: 18 U/L (ref 0–53)
AST: 19 U/L (ref 0–37)
Albumin: 4.7 g/dL (ref 3.5–5.2)
Alkaline Phosphatase: 50 U/L (ref 39–117)
BUN: 10 mg/dL (ref 6–23)
CO2: 29 mEq/L (ref 19–32)
Calcium: 10.3 mg/dL (ref 8.4–10.5)
Chloride: 103 mEq/L (ref 96–112)
Creatinine, Ser: 0.83 mg/dL (ref 0.40–1.50)
GFR: 92.57 mL/min (ref >60.00)
Glucose, Bld: 98 mg/dL (ref 70–99)
Potassium: 4.7 mEq/L (ref 3.5–5.1)
Sodium: 140 mEq/L (ref 135–145)
Total Bilirubin: 0.6 mg/dL (ref 0.2–1.2)
Total Protein: 7.2 g/dL (ref 6.0–8.3)

## 2018-02-18 LAB — CBC WITH DIFFERENTIAL/PLATELET
Basophils Absolute: 0.1 10*3/uL (ref 0.0–0.1)
Basophils Relative: 1.7 % (ref 0.0–3.0)
EOS ABS: 0.2 10*3/uL (ref 0.0–0.7)
Eosinophils Relative: 4.7 % (ref 0.0–5.0)
HCT: 47.5 % (ref 39.0–52.0)
Hemoglobin: 16.2 g/dL (ref 13.0–17.0)
Lymphocytes Relative: 35.8 % (ref 12.0–46.0)
Lymphs Abs: 1.7 10*3/uL (ref 0.7–4.0)
MCHC: 34.2 g/dL (ref 30.0–36.0)
MCV: 90 fl (ref 78.0–100.0)
Monocytes Absolute: 0.5 10*3/uL (ref 0.1–1.0)
Monocytes Relative: 9.9 % (ref 3.0–12.0)
Neutro Abs: 2.3 10*3/uL (ref 1.4–7.7)
Neutrophils Relative %: 47.9 % (ref 43.0–77.0)
Platelets: 211 10*3/uL (ref 150.0–400.0)
RBC: 5.28 Mil/uL (ref 4.22–5.81)
RDW: 12.8 % (ref 11.5–15.5)
WBC: 4.7 10*3/uL (ref 4.0–10.5)

## 2018-02-18 LAB — HEMOGLOBIN A1C: Hgb A1c MFr Bld: 5.7 % (ref 4.6–6.5)

## 2018-02-18 LAB — LIPID PANEL
Cholesterol: 212 mg/dL — ABNORMAL HIGH (ref 0–200)
HDL: 42.7 mg/dL (ref >39.00)
LDL Cholesterol: 145 mg/dL — ABNORMAL HIGH (ref 0–99)
NonHDL: 168.94
TRIGLYCERIDES: 119 mg/dL (ref 0.0–149.0)
Total CHOL/HDL Ratio: 5
VLDL: 23.8 mg/dL (ref 0.0–40.0)

## 2018-02-18 LAB — PSA, MEDICARE: PSA: 0.97 ng/ml (ref 0.10–4.00)

## 2018-02-18 LAB — TSH: TSH: 2.93 u[IU]/mL (ref 0.35–4.50)

## 2018-02-18 MED ORDER — PROPRANOLOL HCL 20 MG PO TABS: ORAL_TABLET | ORAL | 3 refills | Status: DC

## 2018-02-18 NOTE — Patient Instructions (Signed)
  Tests ordered today. Your results will be released to [place] (or called to you) after review, usually within 72hours after test completion. If any changes need to be made, you will be notified at that same time.   Medications reviewed and updated.  Changes include :  none   Your prescription(s) have been submitted to your pharmacy. Please take as directed and contact our office if you believe you are having problem(s) with the medication(s).   Please followup in one year

## 2018-02-18 NOTE — Assessment & Plan Note (Signed)
BP well controlled Current regimen effective and well tolerated Continue current medications at current doses Cmp, cbc, tsh FU annually

## 2018-02-18 NOTE — Assessment & Plan Note (Signed)
psa

## 2018-02-18 NOTE — Addendum Note (Signed)
Addended by: Delice Bison E on: 02/18/2018 03:51 PM   Modules accepted: Orders

## 2018-02-18 NOTE — Assessment & Plan Note (Signed)
Diet controlled Lipids, cmp, tsh Continue regular exercise, healthy diet

## 2018-02-18 NOTE — Assessment & Plan Note (Signed)
H/o hyperglycemia Has lost weight a1c

## 2018-02-18 NOTE — Assessment & Plan Note (Signed)
Controlled, stable Continue current dose of medication  

## 2018-02-19 ENCOUNTER — Encounter: Payer: Self-pay | Admitting: Internal Medicine

## 2018-04-08 DIAGNOSIS — G8929 Other chronic pain: Secondary | ICD-10-CM | POA: Diagnosis not present

## 2018-04-08 DIAGNOSIS — M5442 Lumbago with sciatica, left side: Secondary | ICD-10-CM | POA: Diagnosis not present

## 2018-06-05 ENCOUNTER — Ambulatory Visit (INDEPENDENT_AMBULATORY_CARE_PROVIDER_SITE_OTHER): Payer: Medicare Other | Admitting: Internal Medicine

## 2018-06-05 ENCOUNTER — Other Ambulatory Visit: Payer: Self-pay

## 2018-06-05 ENCOUNTER — Ambulatory Visit: Payer: Medicare Other | Admitting: Internal Medicine

## 2018-06-05 ENCOUNTER — Encounter: Payer: Self-pay | Admitting: Internal Medicine

## 2018-06-05 DIAGNOSIS — H1031 Unspecified acute conjunctivitis, right eye: Secondary | ICD-10-CM

## 2018-06-05 MED ORDER — POLYMYXIN B-TRIMETHOPRIM 10000-0.1 UNIT/ML-% OP SOLN: 1.0000 [drp] | OPHTHALMIC | 0 refills | Status: DC

## 2018-06-05 NOTE — Progress Notes (Signed)
Virtual Visit via Video Note  I connected with Ricky Tate on 06/05/18 at  9:30 AM EDT by a video enabled telemedicine application and verified that I am speaking with the correct person using two identifiers.   I discussed the limitations of evaluation and management by telemedicine and the availability of in person appointments. The patient expressed understanding and agreed to proceed.  The patient is currently at home and I am in the office.    No referring provider.    History of Present Illness: This is an acute visit for pink eye.  For the past 48 hours he has had right eye redness, a lot of weeping with mild discharge, irritation.  It feels like the eye is scratchy.  It is sensitive in general and also sensitive to the light.  He states he does have some eye pain intermittently and change in vision related to the discharge.  He denies other cold symptoms, but does have rhinorrhea out of the right nostril.  He has not had any fevers, chills, nasal congestion, sinus pain, sore throat, cough, wheeze or shortness of breath.  He has tried lubricating eyedrops and he has been taking an allergy pill for the past couple of days and he feels that this has helped a little.  He denies any sick contacts.  Review of systems: Per HPI   Social History   Socioeconomic History  . Marital status: Married    Spouse name: Not on file  . Number of children: Not on file  . Years of education: Not on file  . Highest education level: Not on file  Occupational History  . Occupation: Chief Executive Officer: [employer]  Social Needs  . Financial resource strain: Not on file  . Food insecurity:    Worry: Not on file    Inability: Not on file  . Transportation needs:    Medical: Not on file    Non-medical: Not on file  Tobacco Use  . Smoking status: Never Smoker  . Smokeless tobacco: Never Used  Substance and Sexual Activity  . Alcohol use: Yes   Alcohol/week: 14.0 standard drinks    Types: 14 Glasses of wine per week    Comment: 2 drinks per day  . Drug use: No  . Sexual activity: Not on file  Lifestyle  . Physical activity:    Days per week: Not on file    Minutes per session: Not on file  . Stress: Not on file  Relationships  . Social connections:    Talks on phone: Not on file    Gets together: Not on file    Attends religious service: Not on file    Active member of club or organization: Not on file    Attends meetings of clubs or organizations: Not on file    Relationship status: Not on file  Other Topics Concern  . Not on file  Social History Narrative   Regular exercise     Observations/Objective: Appears well in NAD Unable to visualize the eye enough to describe, but no obvious swelling or gross discharge  Assessment and Plan:  See Problem List for Assessment and Plan of chronic medical problems.   Follow Up Instructions:    I discussed the assessment and treatment plan with the patient. The patient was provided an opportunity to ask questions and all were answered. The patient agreed with the plan and demonstrated an understanding of the instructions.  The patient was advised to call back or seek an in-person evaluation if the symptoms worsen or if the condition fails to improve as anticipated.    Binnie Rail, MD

## 2018-06-05 NOTE — Assessment & Plan Note (Signed)
Symptoms consistent with possible conjunctivitis, but discussed with him that he could also have a different form of inflammation in the eyes such as scleritis or episcleritis Will try an antibacterial eyedrop-sent to pharmacy If no improvement or any worsening over the next few days he will need to see his optometrist  He will call with any questions or concerns

## 2018-11-08 DIAGNOSIS — Z23 Encounter for immunization: Secondary | ICD-10-CM | POA: Diagnosis not present

## 2018-11-13 DIAGNOSIS — H04123 Dry eye syndrome of bilateral lacrimal glands: Secondary | ICD-10-CM | POA: Diagnosis not present

## 2019-03-07 ENCOUNTER — Ambulatory Visit: Payer: Medicare Other | Attending: Internal Medicine

## 2019-03-07 DIAGNOSIS — Z23 Encounter for immunization: Secondary | ICD-10-CM | POA: Insufficient documentation

## 2019-03-07 NOTE — Progress Notes (Signed)
   Covid-19 Vaccination Clinic  Name:  RAHMON KLEMA    MRN: [id number] DOB: 1950/05/11  03/07/2019  Mr. Perdew was observed post Covid-19 immunization for 15 minutes without incidence. He was provided with Vaccine Information Sheet and instruction to access the V-Safe system.   Mr. Osbey was instructed to call 911 with any severe reactions post vaccine: Marland Kitchen Difficulty breathing  . Swelling of your face and throat  . A fast heartbeat  . A bad rash all over your body  . Dizziness and weakness    Immunizations Administered    Name Date Dose VIS Date Route   Pfizer COVID-19 Vaccine 03/07/2019  8:34 AM 0.3 mL 01/02/2019 Intramuscular   Manufacturer: [place]   Lot: [id number]   [place]: [id number]

## 2019-03-12 DIAGNOSIS — Z20822 Contact with and (suspected) exposure to covid-19: Secondary | ICD-10-CM | POA: Diagnosis not present

## 2019-03-30 ENCOUNTER — Ambulatory Visit: Payer: Medicare Other | Attending: Internal Medicine

## 2019-03-30 DIAGNOSIS — Z23 Encounter for immunization: Secondary | ICD-10-CM | POA: Insufficient documentation

## 2019-03-30 NOTE — Progress Notes (Signed)
   Covid-19 Vaccination Clinic  Name:  Ricky Tate    MRN: [id number] DOB: [birth year]  03/30/2019  [name] was observed post Covid-19 immunization for 15 minutes without incident. He was provided with Vaccine Information Sheet and instruction to access the V-Safe system.   [name] was instructed to call 911 with any severe reactions post vaccine: Marland Kitchen Difficulty breathing  . Swelling of face and throat  . A fast heartbeat  . A bad rash all over body  . Dizziness and weakness   Immunizations Administered    Name Date Dose VIS Date Route   Pfizer COVID-19 Vaccine 03/30/2019  8:39 AM 0.3 mL 01/02/2019 Intramuscular   Manufacturer: [place]   Lot: [id number]   [place]: [id number]

## 2019-04-17 ENCOUNTER — Other Ambulatory Visit: Payer: Self-pay | Admitting: Internal Medicine

## 2019-05-12 ENCOUNTER — Encounter: Payer: Self-pay | Admitting: Internal Medicine

## 2019-05-18 NOTE — Patient Instructions (Addendum)
  Blood work was ordered.     Medications reviewed and updated.  Changes include :   none  Your prescription(s) have been submitted to your pharmacy. Please take as directed and contact our office if you believe you are having problem(s) with the medication(s).     Please followup in 1 year  

## 2019-05-18 NOTE — Progress Notes (Signed)
Subjective:    Patient ID: Ricky Tate, male    DOB: September 14, 1950, 69 y.o.   MRN: [id number]  HPI The patient is here for follow up of their chronic medical problems, including hypertension, intention tremor, prediabetes, hyperlipidemia.   He is taking his medication as prescribed.   He is exercising regularly.  He walks daily, also yoga.     He has some lower back pain when he first wakes up.  It goes away once he starts moving.  He has noticed his stamina is less.  He is compliant with a low sugar diet.      The 10-year ASCVD risk score Ricky Tate Ricky Tate Ricky Tate., et al., 2013) is: 21%   Values used to calculate the score:     Age: 55 years     Sex: Male     Is Non-Hispanic African American: No     Diabetic: No     Tobacco smoker: No     Systolic Blood Pressure: [id number] mmHg     Is BP treated: Yes     HDL Cholesterol: 42.7 mg/dL     Total Cholesterol: 212 mg/dL   Medications and allergies reviewed with patient and updated if appropriate.  Patient Active Problem List   Diagnosis Date Noted  . Prediabetes 02/18/2018  . Encounter for prostate cancer screening 02/18/2018  . Psoriasis 10/11/2011  . Hyperlipidemia 01/31/2010  . Essential hypertension 01/31/2010  . SKIN CANCER, HX OF 01/31/2010  . PERSONAL HISTORY OF ALLERGY TO SEAFOOD 06/13/2009  . Intention tremor 10/26/2008  . TINNITUS, LEFT 10/26/2008  . COLONIC POLYPS, HX OF 10/26/2008    Current Outpatient Medications on File Prior to Visit  Medication Sig Dispense Refill  . propranolol (INDERAL) 20 MG tablet Take 1 tablet (20 mg) by mouth twice daily. 180 tablet 3   No current facility-administered medications on file prior to visit.    Past Medical History:  Diagnosis Date  . Benign prostatic hypertrophy   . Cancer ([place])    skin,squamous cell..[facility] of  . Cataract    bialteral - just watching cataracts  . Gilbert's syndrome   . Hyperlipidemia    NMR:LDL 133(1391/570) HDL 77,TG 61 - diet controlled  .  Hyperplastic colon polyp    pmh  . Hypertension   . Syncope    vaso vagal     Past Surgical History:  Procedure Laterality Date  . COLONOSCOPY  05/2011    Dr Olevia Perches  . COLONOSCOPY W/ POLYPECTOMY  2008  . VASECTOMY      Social History   Socioeconomic History  . Marital status: Married    Spouse name: Not on file  . Number of children: Not on file  . Years of education: Not on file  . Highest education level: Not on file  Occupational History  . Occupation: Chief Executive Officer: [employer]  Tobacco Use  . Smoking status: Never Smoker  . Smokeless tobacco: Never Used  Substance and Sexual Activity  . Alcohol use: Yes    Alcohol/week: 14.0 standard drinks    Types: 14 Glasses of wine per week    Comment: 2 drinks per day  . Drug use: No  . Sexual activity: Not on file  Other Topics Concern  . Not on file  Social History Narrative   Regular exercise   Social Determinants of Health   Financial Resource Strain:   . Difficulty of Paying Living Expenses:   Food Insecurity:   .  Worried About Charity fundraiser in the Last Year:   . Arboriculturist in the Last Year:   Transportation Needs:   . Film/video editor (Medical):   Marland Kitchen Lack of Transportation (Non-Medical):   Physical Activity:   . Days of Exercise per Week:   . Minutes of Exercise per Session:   Stress:   . Feeling of Stress :   Social Connections:   . Frequency of Communication with Friends and Family:   . Frequency of Social Gatherings with Friends and Family:   . Attends Religious Services:   . Active Member of Clubs or Organizations:   . Attends Archivist Meetings:   Marland Kitchen Marital Status:     Family History  Problem Relation Age of Onset  . Alzheimer's disease Mother   . Colon cancer Mother 27  . Lung cancer Father        smoker  . Alzheimer's disease Maternal Aunt        x2 aunts.x 2  . Alzheimer's disease Maternal Uncle        x 2  . Heart  disease Paternal Uncle        CABG,CAD  . Diabetes Paternal Uncle   . Hypertension Maternal Grandmother   . Hypertension Maternal Grandfather   . Stroke Maternal Grandfather 40  . Hypertension Paternal Grandmother   . Stroke Paternal Grandmother        in 63s  . Hypertension Paternal Grandfather   . Stroke Paternal Grandfather        late 68s  . Schizophrenia Unknown        aunt  . Diabetes Paternal Aunt   . Stomach cancer Neg Hx   . Heart attack Neg Hx   . Colon polyps Neg Hx   . Rectal cancer Neg Hx     Review of Systems  Constitutional: Negative for chills and fever.  HENT: Positive for congestion (mild) and rhinorrhea (mild).   Respiratory: Negative for cough, shortness of breath and wheezing.   Cardiovascular: Negative for chest pain, palpitations and leg swelling.  Neurological: Negative for dizziness (mild for past couple of week - esp with yoga), light-headedness and headaches.       Objective:   Vitals:   05/19/19 0757  BP: 136/84  Pulse: 65  Resp: 16  Temp: 98.3 F (36.8 C)  SpO2: 98%   BP Readings from Last 3 Encounters:  05/19/19 136/84  02/18/18 124/82  11/21/16 114/74   Wt Readings from Last 3 Encounters:  05/19/19 184 lb 6.4 oz (83.6 kg)  02/18/18 177 lb (80.3 kg)  11/21/16 195 lb (88.5 kg)   Body mass index is 27.23 kg/m.   Physical Exam    Constitutional: Appears well-developed and well-nourished. No distress.  HENT:  Head: Normocephalic and atraumatic.  Neck: Neck supple. No tracheal deviation present. No thyromegaly present.  No cervical lymphadenopathy Cardiovascular: Normal rate, regular rhythm and normal heart sounds.   No murmur heard. No carotid bruit .  No edema Pulmonary/Chest: Effort normal and breath sounds normal. No respiratory distress. No has no wheezes. No rales.  Skin: Skin is warm and dry. Not diaphoretic.  Psychiatric: Normal mood and affect. Behavior is normal.      Assessment & Plan:    See Problem List for  Assessment and Plan of chronic medical problems.    This visit occurred during the SARS-CoV-2 public health emergency.  Safety protocols were in place, including screening questions prior to the  visit, additional usage of staff PPE, and extensive cleaning of exam room while observing appropriate contact time as indicated for disinfecting solutions.

## 2019-05-19 ENCOUNTER — Other Ambulatory Visit: Payer: Self-pay

## 2019-05-19 ENCOUNTER — Ambulatory Visit (INDEPENDENT_AMBULATORY_CARE_PROVIDER_SITE_OTHER): Payer: Medicare Other | Admitting: Internal Medicine

## 2019-05-19 ENCOUNTER — Encounter: Payer: Self-pay | Admitting: Internal Medicine

## 2019-05-19 VITALS — BP 136/84 | HR 65 | Temp 98.3°F | Resp 16 | Ht 69.0 in | Wt 184.4 lb

## 2019-05-19 DIAGNOSIS — E782 Mixed hyperlipidemia: Secondary | ICD-10-CM

## 2019-05-19 DIAGNOSIS — G252 Other specified forms of tremor: Secondary | ICD-10-CM

## 2019-05-19 DIAGNOSIS — R7303 Prediabetes: Secondary | ICD-10-CM | POA: Diagnosis not present

## 2019-05-19 DIAGNOSIS — Z125 Encounter for screening for malignant neoplasm of prostate: Secondary | ICD-10-CM

## 2019-05-19 DIAGNOSIS — I10 Essential (primary) hypertension: Secondary | ICD-10-CM | POA: Diagnosis not present

## 2019-05-19 DIAGNOSIS — I700 Atherosclerosis of aorta: Secondary | ICD-10-CM

## 2019-05-19 DIAGNOSIS — Z23 Encounter for immunization: Secondary | ICD-10-CM

## 2019-05-19 LAB — LIPID PANEL
Cholesterol: 200 mg/dL (ref 0–200)
HDL: 59.1 mg/dL (ref >39.00)
LDL Cholesterol: 125 mg/dL — ABNORMAL HIGH (ref 0–99)
NonHDL: 141.36
Total CHOL/HDL Ratio: 3
Triglycerides: 82 mg/dL (ref 0.0–149.0)
VLDL: 16.4 mg/dL (ref 0.0–40.0)

## 2019-05-19 LAB — CBC WITH DIFFERENTIAL/PLATELET
Basophils Absolute: 0.1 10*3/uL (ref 0.0–0.1)
Basophils Relative: 1.8 % (ref 0.0–3.0)
Eosinophils Absolute: 0.2 10*3/uL (ref 0.0–0.7)
Eosinophils Relative: 5.2 % — ABNORMAL HIGH (ref 0.0–5.0)
HCT: 44.2 % (ref 39.0–52.0)
Hemoglobin: 15.3 g/dL (ref 13.0–17.0)
Lymphocytes Relative: 31.2 % (ref 12.0–46.0)
Lymphs Abs: 1.2 10*3/uL (ref 0.7–4.0)
MCHC: 34.7 g/dL (ref 30.0–36.0)
MCV: 91.7 fl (ref 78.0–100.0)
Monocytes Absolute: 0.4 10*3/uL (ref 0.1–1.0)
Monocytes Relative: 9.5 % (ref 3.0–12.0)
Neutro Abs: 2 10*3/uL (ref 1.4–7.7)
Neutrophils Relative %: 52.3 % (ref 43.0–77.0)
Platelets: 196 10*3/uL (ref 150.0–400.0)
RBC: 4.82 Mil/uL (ref 4.22–5.81)
RDW: 13.6 % (ref 11.5–15.5)
WBC: 3.8 10*3/uL — ABNORMAL LOW (ref 4.0–10.5)

## 2019-05-19 LAB — COMPREHENSIVE METABOLIC PANEL
ALT: 14 U/L (ref 0–53)
AST: 18 U/L (ref 0–37)
Albumin: 4.6 g/dL (ref 3.5–5.2)
Alkaline Phosphatase: 51 U/L (ref 39–117)
BUN: 11 mg/dL (ref 6–23)
CO2: 30 mEq/L (ref 19–32)
Calcium: 9.7 mg/dL (ref 8.4–10.5)
Chloride: 101 mEq/L (ref 96–112)
Creatinine, Ser: 0.81 mg/dL (ref 0.40–1.50)
GFR: 94.85 mL/min (ref >60.00)
Glucose, Bld: 85 mg/dL (ref 70–99)
Potassium: 4.5 mEq/L (ref 3.5–5.1)
Sodium: 138 mEq/L (ref 135–145)
Total Bilirubin: 1 mg/dL (ref 0.2–1.2)
Total Protein: 7.2 g/dL (ref 6.0–8.3)

## 2019-05-19 LAB — PSA, MEDICARE: PSA: 0.88 ng/ml (ref 0.10–4.00)

## 2019-05-19 LAB — HEMOGLOBIN A1C: Hgb A1c MFr Bld: 5.3 % (ref 4.6–6.5)

## 2019-05-19 MED ORDER — PROPRANOLOL HCL 20 MG PO TABS: ORAL_TABLET | ORAL | 3 refills | Status: DC

## 2019-05-19 NOTE — Assessment & Plan Note (Signed)
Chronic Check a1c Low sugar / carb diet Stressed regular exercise  

## 2019-05-19 NOTE — Assessment & Plan Note (Addendum)
Chronic Check lipid panel  Controlled with lifestyle Continue regular exercise and healthy diet Discussed considering starting a statin, but he would prefer to control it with lifestyle.  Discussed possibly doing a CT of his coronary arteries to help stratify his risk, which would help [place] determine how much he needs the statin.  He agrees with this and I will order He does have some more distant family history of heart disease

## 2019-05-19 NOTE — Assessment & Plan Note (Signed)
psa

## 2019-05-19 NOTE — Assessment & Plan Note (Signed)
Chronic BP well controlled Current regimen effective and well tolerated Continue current medications at current doses cmp  

## 2019-05-21 ENCOUNTER — Encounter: Payer: Self-pay | Admitting: Internal Medicine

## 2019-05-29 ENCOUNTER — Ambulatory Visit (INDEPENDENT_AMBULATORY_CARE_PROVIDER_SITE_OTHER)
Admission: RE | Admit: 2019-05-29 | Discharge: 2019-05-29 | Disposition: A | Discharge status: Home / Self Care | Payer: Self-pay | Source: Ambulatory Visit | Attending: Internal Medicine | Admitting: Internal Medicine

## 2019-05-29 ENCOUNTER — Other Ambulatory Visit: Payer: Self-pay

## 2019-05-29 DIAGNOSIS — I700 Atherosclerosis of aorta: Secondary | ICD-10-CM | POA: Diagnosis not present

## 2019-05-29 DIAGNOSIS — E782 Mixed hyperlipidemia: Secondary | ICD-10-CM

## 2019-06-02 ENCOUNTER — Encounter: Payer: Self-pay | Admitting: Internal Medicine

## 2019-06-02 DIAGNOSIS — I77810 Thoracic aortic ectasia: Secondary | ICD-10-CM | POA: Insufficient documentation

## 2019-06-02 DIAGNOSIS — R931 Abnormal findings on diagnostic imaging of heart and coronary circulation: Secondary | ICD-10-CM | POA: Insufficient documentation

## 2019-06-04 ENCOUNTER — Telehealth: Payer: Self-pay | Admitting: Internal Medicine

## 2019-06-04 ENCOUNTER — Encounter: Payer: Self-pay | Admitting: Internal Medicine

## 2019-06-04 NOTE — Telephone Encounter (Signed)
Saline nasal spray, mucinex ( not mucinex dm) and coricidan cold products

## 2019-06-04 NOTE — Telephone Encounter (Signed)
Pt aware of response below.  

## 2019-06-04 NOTE — Telephone Encounter (Signed)
Spoke with pt on the phone in regards.

## 2019-06-04 NOTE — Telephone Encounter (Signed)
New message:   Pt states he is having some mild symptoms of a sinus infection and would like some recommendations on what to take due to his hypertension. He states he does not want to take sudafed. Please advise.

## 2019-06-18 ENCOUNTER — Telehealth: Payer: Self-pay | Admitting: Internal Medicine

## 2019-06-18 ENCOUNTER — Telehealth (INDEPENDENT_AMBULATORY_CARE_PROVIDER_SITE_OTHER): Payer: Medicare Other | Admitting: Internal Medicine

## 2019-06-18 ENCOUNTER — Encounter: Payer: Self-pay | Admitting: Internal Medicine

## 2019-06-18 DIAGNOSIS — J0100 Acute maxillary sinusitis, unspecified: Secondary | ICD-10-CM | POA: Insufficient documentation

## 2019-06-18 MED ORDER — AMOXICILLIN-POT CLAVULANATE 875-125 MG PO TABS
1.0000 | ORAL_TABLET | Freq: Two times a day (BID) | ORAL | 0 refills | Status: DC
Start: 2019-06-18 — End: 2020-03-07

## 2019-06-18 NOTE — Telephone Encounter (Signed)
New Message:   Pt is calling and states he has had a cold for a week and experiencing some vertigo. Pt would like some advice on what to do. Pt has not been tested for covid. Please advise.

## 2019-06-18 NOTE — Telephone Encounter (Signed)
Virtual appointment scheduled. 

## 2019-06-18 NOTE — Assessment & Plan Note (Signed)
Likely bacterial  Start augmentin otc cold medications Rest, fluid Call if no improvement  

## 2019-06-18 NOTE — Progress Notes (Signed)
Virtual Visit via Video Note  I connected with Ricky Tate on 06/18/19 at 11:00 AM EDT by a video enabled telemedicine application and verified that I am speaking with the correct person using two identifiers.   I discussed the limitations of evaluation and management by telemedicine and the availability of in person appointments. The patient expressed understanding and agreed to proceed.  Present for the visit:  Myself, Dr Billey Gosling, Markham Jordan.  The patient is currently at home and I am in the office.    No referring provider.    History of Present Illness: He is here for an acute visit for cold symptoms.  His symptoms started over one week ago  He is experiencing sinus congestion, pressure, headache, fatigue, mild dizziness and PND.  His symptoms have persisted and he feels like his sinuses are clogged.   He has tried taking coricidin - temporary relief   Review of Systems  Constitutional: Positive for malaise/fatigue. Negative for fever.  HENT: Positive for congestion and sinus pain. Negative for ear pain and sore throat.        Pnd  Respiratory: Negative for cough, shortness of breath and wheezing.   Neurological: Positive for dizziness (mild) and headaches.      Social History   Socioeconomic History  . Marital status: Married    Spouse name: Not on file  . Number of children: Not on file  . Years of education: Not on file  . Highest education level: Not on file  Occupational History  . Occupation: Chief Executive Officer: [employer]  Tobacco Use  . Smoking status: Never Smoker  . Smokeless tobacco: Never Used  Substance and Sexual Activity  . Alcohol use: Yes    Alcohol/week: 14.0 standard drinks    Types: 14 Glasses of wine per week    Comment: 2 drinks per day  . Drug use: No  . Sexual activity: Not on file  Other Topics Concern  . Not on file  Social History Narrative   Regular exercise   Social Determinants  of Health   Financial Resource Strain:   . Difficulty of Paying Living Expenses:   Food Insecurity:   . Worried About Charity fundraiser in the Last Year:   . Arboriculturist in the Last Year:   Transportation Needs:   . Film/video editor (Medical):   Marland Kitchen Lack of Transportation (Non-Medical):   Physical Activity:   . Days of Exercise per Week:   . Minutes of Exercise per Session:   Stress:   . Feeling of Stress :   Social Connections:   . Frequency of Communication with Friends and Family:   . Frequency of Social Gatherings with Friends and Family:   . Attends Religious Services:   . Active Member of Clubs or Organizations:   . Attends Archivist Meetings:   Marland Kitchen Marital Status:      Observations/Objective: Appears well in NAD Breathing normally skin appears warm and dry  Assessment and Plan:  See Problem List for Assessment and Plan of chronic medical problems.   Follow Up Instructions:    I discussed the assessment and treatment plan with the patient. The patient was provided an opportunity to ask questions and all were answered. The patient agreed with the plan and demonstrated an understanding of the instructions.   The patient was advised to call back or seek an in-person evaluation if the symptoms worsen or  if the condition fails to improve as anticipated.    Binnie Rail, MD

## 2019-10-30 DIAGNOSIS — H1130 Conjunctival hemorrhage, unspecified eye: Secondary | ICD-10-CM | POA: Diagnosis not present

## 2019-12-01 ENCOUNTER — Encounter: Payer: Self-pay | Admitting: Internal Medicine

## 2019-12-01 DIAGNOSIS — Z20822 Contact with and (suspected) exposure to covid-19: Secondary | ICD-10-CM | POA: Diagnosis not present

## 2019-12-02 DIAGNOSIS — Z23 Encounter for immunization: Secondary | ICD-10-CM | POA: Diagnosis not present

## 2020-02-01 DIAGNOSIS — Z20822 Contact with and (suspected) exposure to covid-19: Secondary | ICD-10-CM | POA: Diagnosis not present

## 2020-02-01 DIAGNOSIS — Z1152 Encounter for screening for COVID-19: Secondary | ICD-10-CM | POA: Diagnosis not present

## 2020-02-04 ENCOUNTER — Encounter: Payer: Self-pay | Admitting: Internal Medicine

## 2020-02-05 DIAGNOSIS — Z20822 Contact with and (suspected) exposure to covid-19: Secondary | ICD-10-CM | POA: Diagnosis not present

## 2020-03-03 ENCOUNTER — Ambulatory Visit (INDEPENDENT_AMBULATORY_CARE_PROVIDER_SITE_OTHER): Payer: Medicare Other

## 2020-03-03 ENCOUNTER — Other Ambulatory Visit: Payer: Self-pay

## 2020-03-03 DIAGNOSIS — Z0000 Encounter for general adult medical examination without abnormal findings: Secondary | ICD-10-CM

## 2020-03-03 DIAGNOSIS — Z23 Encounter for immunization: Secondary | ICD-10-CM

## 2020-03-03 NOTE — Patient Instructions (Addendum)
[name] , Thank you for taking time to come for your Medicare Wellness Visit. I appreciate your ongoing commitment to your health goals. Please review the following plan we discussed and let me know if I can assist you in the future.   Screening recommendations/referrals: Colonoscopy: 11/21/2016; due every 3 years Recommended yearly ophthalmology/optometry visit for glaucoma screening and checkup Recommended yearly dental visit for hygiene and checkup  Vaccinations: Influenza vaccine: 12/02/2019 Pneumococcal vaccine: 05/19/2019, 03/03/2020 Tdap vaccine: 01/31/2010; due every 10 years  Shingles vaccine: never done   Covid-19: 03/07/2019, 03/30/2019, 12/02/2019  Advanced directives: Please bring a copy of your health care power of attorney and living will to the office at your convenience.  Conditions/risks identified: Yes; Reviewed health maintenance screenings with patient today and relevant education, vaccines, and/or referrals were provided. Please continue to do your personal lifestyle choices by: daily care of teeth and gums, regular physical activity (goal should be 5 days a week for 30 minutes), eat a healthy diet, avoid tobacco and drug use, limiting any alcohol intake, taking a low-dose aspirin (if not allergic or have been advised by your provider otherwise) and taking vitamins and minerals as recommended by your provider. Continue doing brain stimulating activities (puzzles, reading, adult coloring books, staying active) to keep memory sharp. Continue to eat heart healthy diet (full of fruits, vegetables, whole grains, lean protein, water--limit salt, fat, and sugar intake) and increase physical activity as tolerated.   Next appointment: Please schedule your next Medicare Wellness Visit with your Nurse Health Advisor in 1 year by calling [phone].  Preventive Care 37 Years and Older, Male Preventive care refers to lifestyle choices and visits with your health care provider that can  promote health and wellness. What does preventive care include?  A yearly physical exam. This is also called an annual well check.  Dental exams once or twice a year.  Routine eye exams. Ask your health care provider how often you should have your eyes checked.  Personal lifestyle choices, including:  Daily care of your teeth and gums.  Regular physical activity.  Eating a healthy diet.  Avoiding tobacco and drug use.  Limiting alcohol use.  Practicing safe sex.  Taking low doses of aspirin every day.  Taking vitamin and mineral supplements as recommended by your health care provider. What happens during an annual well check? The services and screenings done by your health care provider during your annual well check will depend on your age, overall health, lifestyle risk factors, and family history of disease. Counseling  Your health care provider may ask you questions about your:  Alcohol use.  Tobacco use.  Drug use.  Emotional well-being.  Home and relationship well-being.  Sexual activity.  Eating habits.  History of falls.  Memory and ability to understand (cognition).  Work and work Statistician. Screening  You may have the following tests or measurements:  Height, weight, and BMI.  Blood pressure.  Lipid and cholesterol levels. These may be checked every 5 years, or more frequently if you are over 71 years old.  Skin check.  Lung cancer screening. You may have this screening every year starting at age 28 if you have a 30-pack-year history of smoking and currently smoke or have quit within the past 15 years.  Fecal occult blood test (FOBT) of the stool. You may have this test every year starting at age 12.  Flexible sigmoidoscopy or colonoscopy. You may have a sigmoidoscopy every 5 years or a colonoscopy every 10  years starting at age 49.  Prostate cancer screening. Recommendations will vary depending on your family history and other  risks.  Hepatitis C blood test.  Hepatitis B blood test.  Sexually transmitted disease (STD) testing.  Diabetes screening. This is done by checking your blood sugar (glucose) after you have not eaten for a while (fasting). You may have this done every 1-3 years.  Abdominal aortic aneurysm (AAA) screening. You may need this if you are a current or former smoker.  Osteoporosis. You may be screened starting at age 33 if you are at high risk. Talk with your health care provider about your test results, treatment options, and if necessary, the need for more tests. Vaccines  Your health care provider may recommend certain vaccines, such as:  Influenza vaccine. This is recommended every year.  Tetanus, diphtheria, and acellular pertussis (Tdap, Td) vaccine. You may need a Td booster every 10 years.  Zoster vaccine. You may need this after age 56.  Pneumococcal 13-valent conjugate (PCV13) vaccine. One dose is recommended after age 9.  Pneumococcal polysaccharide (PPSV23) vaccine. One dose is recommended after age 49. Talk to your health care provider about which screenings and vaccines you need and how often you need them. This information is not intended to replace advice given to you by your health care provider. Make sure you discuss any questions you have with your health care provider. Document Released: 02/04/2015 Document Revised: 09/28/2015 Document Reviewed: 11/09/2014 Elsevier Interactive Patient Education  2017 [employer] Prevention in the Home Falls can cause injuries. They can happen to people of all ages. There are many things you can do to make your home safe and to help prevent falls. What can I do on the outside of my home?  Regularly fix the edges of walkways and driveways and fix any cracks.  Remove anything that might make you trip as you walk through a door, such as a raised step or threshold.  Trim any bushes or trees on the path to your home.  Use  bright outdoor lighting.  Clear any walking paths of anything that might make someone trip, such as rocks or tools.  Regularly check to see if handrails are loose or broken. Make sure that both sides of any steps have handrails.  Any raised decks and porches should have guardrails on the edges.  Have any leaves, snow, or ice cleared regularly.  Use sand or salt on walking paths during winter.  Clean up any spills in your garage right away. This includes oil or grease spills. What can I do in the bathroom?  Use night lights.  Install grab bars by the toilet and in the tub and shower. Do not use towel bars as grab bars.  Use non-skid mats or decals in the tub or shower.  If you need to sit down in the shower, use a plastic, non-slip stool.  Keep the floor dry. Clean up any water that spills on the floor as soon as it happens.  Remove soap buildup in the tub or shower regularly.  Attach bath mats securely with double-sided non-slip rug tape.  Do not have throw rugs and other things on the floor that can make you trip. What can I do in the bedroom?  Use night lights.  Make sure that you have a light by your bed that is easy to reach.  Do not use any sheets or blankets that are too big for your bed. They should not hang  down onto the floor.  Have a firm chair that has side arms. You can use this for support while you get dressed.  Do not have throw rugs and other things on the floor that can make you trip. What can I do in the kitchen?  Clean up any spills right away.  Avoid walking on wet floors.  Keep items that you use a lot in easy-to-reach places.  If you need to reach something above you, use a strong step stool that has a grab bar.  Keep electrical cords out of the way.  Do not use floor polish or wax that makes floors slippery. If you must use wax, use non-skid floor wax.  Do not have throw rugs and other things on the floor that can make you trip. What can  I do with my stairs?  Do not leave any items on the stairs.  Make sure that there are handrails on both sides of the stairs and use them. Fix handrails that are broken or loose. Make sure that handrails are as long as the stairways.  Check any carpeting to make sure that it is firmly attached to the stairs. Fix any carpet that is loose or worn.  Avoid having throw rugs at the top or bottom of the stairs. If you do have throw rugs, attach them to the floor with carpet tape.  Make sure that you have a light switch at the top of the stairs and the bottom of the stairs. If you do not have them, ask someone to add them for you. What else can I do to help prevent falls?  Wear shoes that:  Do not have high heels.  Have rubber bottoms.  Are comfortable and fit you well.  Are closed at the toe. Do not wear sandals.  If you use a stepladder:  Make sure that it is fully opened. Do not climb a closed stepladder.  Make sure that both sides of the stepladder are locked into place.  Ask someone to hold it for you, if possible.  Clearly mark and make sure that you can see:  Any grab bars or handrails.  First and last steps.  Where the edge of each step is.  Use tools that help you move around (mobility aids) if they are needed. These include:  Canes.  Walkers.  Scooters.  Crutches.  Turn on the lights when you go into a dark area. Replace any light bulbs as soon as they burn out.  Set up your furniture so you have a clear path. Avoid moving your furniture around.  If any of your floors are uneven, fix them.  If there are any pets around you, be aware of where they are.  Review your medicines with your doctor. Some medicines can make you feel dizzy. This can increase your chance of falling. Ask your doctor what other things that you can do to help prevent falls. This information is not intended to replace advice given to you by your health care provider. Make sure you  discuss any questions you have with your health care provider. Document Released: 11/04/2008 Document Revised: 06/16/2015 Document Reviewed: 02/12/2014 Elsevier Interactive Patient Education  2017 [employer].

## 2020-03-03 NOTE — Progress Notes (Signed)
Subjective:   Ricky Tate is a 70 y.o. male who presents for Medicare Annual/Subsequent preventive examination.  Review of Systems    No ROS. Medicare Wellness Visit. Additional risk factors are reflected in social history. Cardiac Risk Factors include: advanced age (>49men, >11 women);dyslipidemia;family history of premature cardiovascular disease;hypertension;male gender     Objective:    Today's Vitals   03/03/20 1231  BP: 120/70  Pulse: 60  Temp: 98.3 F (36.8 C)  SpO2: 97%  Weight: 185 lb 9.6 oz (84.2 kg)  Height: 5\' 9"  (1.753 m)  PainSc: 0-No pain   Body mass index is 27.41 kg/m.  Advanced Directives 03/03/2020 11/07/2016  Does Patient Have a Medical Advance Directive? Yes Yes  Type of Paramedic of [place];Living will Living will;Healthcare Power of Attorney  Does patient want to make changes to medical advance directive? No - Patient declined -  Copy of [place] in Chart? No - copy requested -    Current Medications (verified) Outpatient Encounter Medications as of 03/03/2020  Medication Sig  . propranolol (INDERAL) 20 MG tablet Take 1 tablet (20 mg) by mouth twice daily.  Marland Kitchen amoxicillin-clavulanate (AUGMENTIN) 875-125 MG tablet Take 1 tablet by mouth 2 (two) times daily. (Patient not taking: Reported on 03/03/2020)   No facility-administered encounter medications on file as of 03/03/2020.    Allergies (verified) Patient has no known allergies.   History: Past Medical History:  Diagnosis Date  . Benign prostatic hypertrophy   . Cancer ([place])    skin,squamous cell..[facility] of  . Cataract    bialteral - just watching cataracts  . Gilbert's syndrome   . Hyperlipidemia    NMR:LDL 133(1391/570) HDL 77,TG 61 - diet controlled  . Hyperplastic colon polyp    pmh  . Hypertension   . Syncope    vaso vagal    Past Surgical History:  Procedure Laterality Date  . COLONOSCOPY  05/2011    Dr Olevia Perches  . COLONOSCOPY  W/ POLYPECTOMY  2008  . VASECTOMY     Family History  Problem Relation Age of Onset  . Alzheimer's disease Mother   . Colon cancer Mother 64  . Lung cancer Father        smoker  . Alzheimer's disease Maternal Aunt        x2 aunts.x 2  . Alzheimer's disease Maternal Uncle        x 2  . Heart disease Paternal Uncle        CABG,CAD  . Diabetes Paternal Uncle   . Hypertension Maternal Grandmother   . Hypertension Maternal Grandfather   . Stroke Maternal Grandfather 45  . Hypertension Paternal Grandmother   . Stroke Paternal Grandmother        in 41s  . Hypertension Paternal Grandfather   . Stroke Paternal Grandfather        late 2s  . Schizophrenia Other        aunt  . Diabetes Paternal Aunt   . Stomach cancer Neg Hx   . Heart attack Neg Hx   . Colon polyps Neg Hx   . Rectal cancer Neg Hx    Social History   Socioeconomic History  . Marital status: Married    Spouse name: Not on file  . Number of children: Not on file  . Years of education: Not on file  . Highest education level: Not on file  Occupational History  . Occupation: Tree surgeon  Employer: [employer]  Tobacco Use  . Smoking status: Never Smoker  . Smokeless tobacco: Never Used  Vaping Use  . Vaping Use: Never used  Substance and Sexual Activity  . Alcohol use: Yes    Alcohol/week: 14.0 standard drinks    Types: 14 Glasses of wine per week    Comment: 2 drinks per day  . Drug use: No  . Sexual activity: Not on file  Other Topics Concern  . Not on file  Social History Narrative   Regular exercise   Social Determinants of Health   Financial Resource Strain: Low Risk   . Difficulty of Paying Living Expenses: Not hard at all  Food Insecurity: No Food Insecurity  . Worried About Charity fundraiser in the Last Year: Never true  . Ran Out of Food in the Last Year: Never true  Transportation Needs: No Transportation Needs  . Lack of Transportation (Medical): No  .  Lack of Transportation (Non-Medical): No  Physical Activity: Sufficiently Active  . Days of Exercise per Week: 5 days  . Minutes of Exercise per Session: 30 min  Stress: No Stress Concern Present  . Feeling of Stress : Not at all  Social Connections: Socially Integrated  . Frequency of Communication with Friends and Family: More than three times a week  . Frequency of Social Gatherings with Friends and Family: More than three times a week  . Attends Religious Services: 1 to 4 times per year  . Active Member of Clubs or Organizations: Yes  . Attends Archivist Meetings: 1 to 4 times per year  . Marital Status: Married    Tobacco Counseling Counseling given: Not Answered   Clinical Intake:  Pre-visit preparation completed: Yes  Pain : No/denies pain Pain Score: 0-No pain     BMI - recorded: 27.41 Nutritional Status: BMI 25 -29 Overweight Nutritional Risks: None Diabetes: No  How often do you need to have someone help you when you read instructions, pamphlets, or other written materials from your doctor or pharmacy?: 1 - Never What is the last grade level you completed in school?: Bachelor's Degree  Diabetic? no  Interpreter Needed?: No  Information entered by :: Lisette Abu, LPN.   Activities of Daily Living In your present state of health, do you have any difficulty performing the following activities: 03/03/2020  Hearing? N  Vision? N  Difficulty concentrating or making decisions? N  Walking or climbing stairs? N  Dressing or bathing? N  Doing errands, shopping? N  Preparing Food and eating ? N  Using the Toilet? N  In the past six months, have you accidently leaked urine? N  Do you have problems with loss of bowel control? N  Managing your Medications? N  Managing your Finances? N  Housekeeping or managing your Housekeeping? N  Some recent data might be hidden    Patient Care Team: Binnie Rail, MD as PCP - General (Internal  Medicine)  Indicate any recent Medical Services you may have received from other than [facility] providers in the past year (date may be approximate).     Assessment:   This is a routine wellness examination for Ricky Tate.  Hearing/Vision screen No exam data present  Dietary issues and exercise activities discussed: Current Exercise Habits: Home exercise routine, Type of exercise: walking, Time (Minutes): 30, Frequency (Times/Week): 5, Weekly Exercise (Minutes/Week): 150, Intensity: Moderate, Exercise limited by: None identified  Goals   None    Depression Screen PHQ  2/9 Scores 03/03/2020 05/19/2019 02/18/2018 09/18/2016  PHQ - 2 Score 0 0 0 0    Fall Risk Fall Risk  03/03/2020 05/19/2019 02/18/2018 09/18/2016  Falls in the past year? 0 0 0 No  Number falls in past yr: 0 0 - -  Injury with Fall? 0 0 - -  Risk for fall due to : No Fall Risks - - -  Follow up - Falls evaluation completed - -    FALL RISK PREVENTION PERTAINING TO THE HOME:  Any stairs in or around the home? Yes  If so, are there any without handrails? No  Home free of loose throw rugs in walkways, pet beds, electrical cords, etc? Yes  Adequate lighting in your home to reduce risk of falls? Yes   ASSISTIVE DEVICES UTILIZED TO PREVENT FALLS:  Life alert? Yes  Use of a cane, walker or w/c? No  Grab bars in the bathroom? No  Shower chair or bench in shower? No  Elevated toilet seat or a handicapped toilet? Yes   TIMED UP AND GO:  Was the test performed? No .  Length of time to ambulate 10 feet: 0 sec.   Gait steady and fast without use of assistive device  Cognitive Function: Normal cognitive status assessed by direct observation by this Nurse Health Advisor. No abnormalities found.          Immunizations Immunization History  Administered Date(s) Administered  . Fluad Quad(high Dose 65+) 11/08/2018  . Influenza, High Dose Seasonal PF 02/18/2018  . Influenza,inj,Quad PF,6+ Mos 11/18/2013  .  Influenza-Unspecified 10/24/2016, 12/02/2019  . PFIZER(Purple Top)SARS-COV-2 Vaccination 03/07/2019, 03/30/2019  . Pneumococcal Conjugate-13 05/19/2019  . Td 01/31/2010  . Zoster 12/22/2013    TDAP status: Due, Education has been provided regarding the importance of this vaccine. Advised may receive this vaccine at local pharmacy or Health Dept. Aware to provide a copy of the vaccination record if obtained from local pharmacy or Health Dept. Verbalized acceptance and understanding.  Flu Vaccine status: Up to date  Pneumococcal vaccine status: Up to date  Covid-19 vaccine status: Completed vaccines  Qualifies for Shingles Vaccine? Yes   Zostavax completed Yes   Shingrix Completed?: No.    Education has been provided regarding the importance of this vaccine. Patient has been advised to call insurance company to determine out of pocket expense if they have not yet received this vaccine. Advised may also receive vaccine at local pharmacy or Health Dept. Verbalized acceptance and understanding.  Screening Tests Health Maintenance  Topic Date Due  . COVID-19 Vaccine (3 - Booster for Pfizer series) 09/30/2019  . COLONOSCOPY (Pts 45-64yrs Insurance coverage will need to be confirmed)  11/22/2019  . TETANUS/TDAP  02/01/2020  . PNA vac Low Risk Adult (2 of 2 - PPSV23) 05/18/2020  . INFLUENZA VACCINE  Completed  . Hepatitis C Screening  Completed    Health Maintenance  Health Maintenance Due  Topic Date Due  . COVID-19 Vaccine (3 - Booster for Pfizer series) 09/30/2019  . COLONOSCOPY (Pts 45-18yrs Insurance coverage will need to be confirmed)  11/22/2019  . TETANUS/TDAP  02/01/2020    Colorectal cancer screening: Type of screening: Colonoscopy. Completed 11/21/2016. Repeat every 3 years  Lung Cancer Screening: (Low Dose CT Chest recommended if Age 21-80 years, 30 pack-year currently smoking OR have quit w/in 15years.) does not qualify.   Lung Cancer Screening Referral:  no  Additional Screening:  Hepatitis C Screening: does qualify; Completed yes  Vision Screening: Recommended annual ophthalmology  exams for early detection of glaucoma and other disorders of the eye. Is the patient up to date with their annual eye exam?  Yes  Who is the provider or what is the name of the office in which the patient attends annual eye exams? [facility] If pt is not established with a provider, would they like to be referred to a provider to establish care? No .   Dental Screening: Recommended annual dental exams for proper oral hygiene  Community Resource Referral / Chronic Care Management: CRR required this visit?  No   CCM required this visit?  No      Plan:     I have personally reviewed and noted the following in the patient's chart:   . Medical and social history . Use of alcohol, tobacco or illicit drugs  . Current medications and supplements . Functional ability and status . Nutritional status . Physical activity . Advanced directives . List of other physicians . Hospitalizations, surgeries, and ER visits in previous 12 months . Vitals . Screenings to include cognitive, depression, and falls . Referrals and appointments  In addition, I have reviewed and discussed with patient certain preventive protocols, quality metrics, and best practice recommendations. A written personalized care plan for preventive services as well as general preventive health recommendations were provided to patient.     Sheral Flow, LPN   [id number]   Nurse Notes: n/a

## 2020-03-07 ENCOUNTER — Other Ambulatory Visit: Payer: Self-pay

## 2020-03-07 ENCOUNTER — Encounter: Payer: Self-pay | Admitting: Family

## 2020-03-07 ENCOUNTER — Telehealth (INDEPENDENT_AMBULATORY_CARE_PROVIDER_SITE_OTHER): Payer: Medicare Other | Admitting: Family

## 2020-03-07 ENCOUNTER — Encounter: Payer: Self-pay | Admitting: Gastroenterology

## 2020-03-07 ENCOUNTER — Other Ambulatory Visit: Payer: Self-pay | Admitting: Family

## 2020-03-07 ENCOUNTER — Ambulatory Visit (INDEPENDENT_AMBULATORY_CARE_PROVIDER_SITE_OTHER)
Admission: RE | Admit: 2020-03-07 | Discharge: 2020-03-07 | Disposition: A | Discharge status: Home / Self Care | Payer: Medicare Other | Source: Ambulatory Visit | Attending: Family | Admitting: Family

## 2020-03-07 DIAGNOSIS — R058 Other specified cough: Secondary | ICD-10-CM | POA: Diagnosis not present

## 2020-03-07 DIAGNOSIS — R059 Cough, unspecified: Secondary | ICD-10-CM | POA: Diagnosis not present

## 2020-03-07 DIAGNOSIS — Z8616 Personal history of COVID-19: Secondary | ICD-10-CM

## 2020-03-07 MED ORDER — BENZONATATE 100 MG PO CAPS: 100.0000 mg | ORAL_CAPSULE | Freq: Three times a day (TID) | ORAL | 0 refills | Status: DC | PRN

## 2020-03-07 NOTE — Progress Notes (Signed)
Ricky Tate is a 70 y.o. male with the following history as recorded in Epic[facility]:  Patient Active Problem List   Diagnosis Date Noted  . Acute non-recurrent maxillary sinusitis 06/18/2019  . 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 Agatston coronary artery calcium score less than 100 -74.7 06/02/2019  . Prediabetes 02/18/2018  . Encounter for prostate cancer screening 02/18/2018  . Psoriasis 10/11/2011  . Hyperlipidemia 01/31/2010  . Essential hypertension 01/31/2010  . SKIN CANCER, HX OF 01/31/2010  . PERSONAL HISTORY OF ALLERGY TO SEAFOOD 06/13/2009  . Intention tremor 10/26/2008  . TINNITUS, LEFT 10/26/2008  . COLONIC POLYPS, HX OF 10/26/2008    Current Outpatient Medications  Medication Sig Dispense Refill  . amoxicillin-clavulanate (AUGMENTIN) 875-125 MG tablet Take 1 tablet by mouth 2 (two) times daily. (Patient not taking: Reported on 03/03/2020) 20 tablet 0  . propranolol (INDERAL) 20 MG tablet Take 1 tablet (20 mg) by mouth twice daily. 180 tablet 3   No current facility-administered medications for this visit.    Allergies: Patient has no known allergies.  Past Medical History:  Diagnosis Date  . Benign prostatic hypertrophy   . Cancer ([place])    skin,squamous cell..[facility] of  . Cataract    bialteral - just watching cataracts  . Gilbert's syndrome   . Hyperlipidemia    NMR:LDL 133(1391/570) HDL 77,TG 61 - diet controlled  . Hyperplastic colon polyp    pmh  . Hypertension   . Syncope    vaso vagal     Past Surgical History:  Procedure Laterality Date  . COLONOSCOPY  05/2011    Dr Olevia Perches  . COLONOSCOPY W/ POLYPECTOMY  2008  . VASECTOMY      Family History  Problem Relation Age of Onset  . Alzheimer's disease Mother   . Colon cancer Mother 29  . Lung cancer Father        smoker  . Alzheimer's disease Maternal Aunt        x2 aunts.x 2  . Alzheimer's disease Maternal Uncle        x 2  . Heart disease Paternal Uncle        CABG,CAD  . Diabetes  Paternal Uncle   . Hypertension Maternal Grandmother   . Hypertension Maternal Grandfather   . Stroke Maternal Grandfather 55  . Hypertension Paternal Grandmother   . Stroke Paternal Grandmother        in 20s  . Hypertension Paternal Grandfather   . Stroke Paternal Grandfather        late 14s  . Schizophrenia Other        aunt  . Diabetes Paternal Aunt   . Stomach cancer Neg Hx   . Heart attack Neg Hx   . Colon polyps Neg Hx   . Rectal cancer Neg Hx     Social History   Tobacco Use  . Smoking status: Never Smoker  . Smokeless tobacco: Never Used  Substance Use Topics  . Alcohol use: Yes    Alcohol/week: 14.0 standard drinks    Types: 14 Glasses of wine per week    Comment: 2 drinks per day    Subjective:   I connected with Ricky Tate on 03/07/20 at 10:20 AM EST by a video enabled telemedicine application and verified that I am speaking with the correct person using two identifiers.   I discussed the limitations of evaluation and management by telemedicine and the availability of in person appointments. The patient expressed understanding and agreed to  proceed. Provider in office/ patient is at home; provider and patient are only 2 people on video call.   Patient was diagnosed with COVID over a month ago; is concerned about "lingering cough"- describes at cough/ feels more like a dry cough; notes he mentioned this concern at his recent Medicare AWV and was recommended for OV; no fever, night sweats; no difficulty breathing or shortness of breath or chest pain;    Objective:  There were no vitals filed for this visit.  General: Well developed, well nourished, in no acute distress  Head: Normocephalic and atraumatic  Lungs: Respirations unlabored;  Neurologic: Alert and oriented; speech intact; face symmetrical;   Assessment:  1. Cough present for greater than 3 weeks   2. History of COVID-19     Plan:  Update CXR today; reassurance that it can take 4-6 weeks for  symptoms to completely resolve; follow-up to be determined.   No follow-ups on file.  Orders Placed This Encounter  Procedures  . DG Chest 2 View    Standing Status:   Future    Number of Occurrences:   1    Standing Expiration Date:   03/07/2021    Order Specific Question:   Reason for Exam (SYMPTOM  OR DIAGNOSIS REQUIRED)    Answer:   persistent cough x 3 weeks    Order Specific Question:   Preferred imaging location?    Answer:   [facility]    Requested Prescriptions    No prescriptions requested or ordered in this encounter

## 2020-03-14 ENCOUNTER — Encounter: Payer: Self-pay | Admitting: Gastroenterology

## 2020-03-23 DIAGNOSIS — G8929 Other chronic pain: Secondary | ICD-10-CM | POA: Insufficient documentation

## 2020-03-23 DIAGNOSIS — M7542 Impingement syndrome of left shoulder: Secondary | ICD-10-CM | POA: Diagnosis not present

## 2020-03-23 DIAGNOSIS — M19012 Primary osteoarthritis, left shoulder: Secondary | ICD-10-CM | POA: Diagnosis not present

## 2020-03-24 ENCOUNTER — Ambulatory Visit (AMBULATORY_SURGERY_CENTER): Payer: Self-pay

## 2020-03-24 ENCOUNTER — Other Ambulatory Visit: Payer: Self-pay

## 2020-03-24 VITALS — Ht 69.0 in | Wt 186.0 lb

## 2020-03-24 DIAGNOSIS — Z86010 Personal history of colonic polyps: Secondary | ICD-10-CM

## 2020-03-24 MED ORDER — GOLYTELY 236 G PO SOLR: 4000.0000 mL | ORAL | 0 refills | Status: DC

## 2020-03-24 NOTE — Progress Notes (Signed)
No egg or soy allergy known to patient  No issues with past sedation with any surgeries or procedures No intubation problems in the past  No FH of Malignant Hyperthermia No diet pills per patient No home 02 use per patient  No blood thinners per patient  Pt denies issues with constipation  No A fib or A flutter  EMMI video via [place] 19 guidelines implemented in PV today with Pt and RN  Pt denies loose or missing teeth, dentures, partials, dental implants, capped or bonded teeth;  NO PA's for preps discussed with pt In PV today  Discussed with pt there will be an out-of-pocket cost for prep and that varies from $0 to 70 dollars  Due to the COVID-19 pandemic we are asking patients to follow certain guidelines.   Pt aware of COVID protocols and [facility] guidelines

## 2020-04-08 ENCOUNTER — Other Ambulatory Visit: Payer: Self-pay

## 2020-04-08 ENCOUNTER — Ambulatory Visit (AMBULATORY_SURGERY_CENTER): Payer: Medicare Other | Admitting: Gastroenterology

## 2020-04-08 ENCOUNTER — Encounter: Payer: Self-pay | Admitting: Gastroenterology

## 2020-04-08 VITALS — BP 111/77 | HR 63 | Temp 96.8°F | Resp 18 | Ht 69.0 in | Wt 186.0 lb

## 2020-04-08 DIAGNOSIS — K635 Polyp of colon: Secondary | ICD-10-CM

## 2020-04-08 DIAGNOSIS — D123 Benign neoplasm of transverse colon: Secondary | ICD-10-CM

## 2020-04-08 DIAGNOSIS — Z860100 Personal history of colon polyps, unspecified: Secondary | ICD-10-CM

## 2020-04-08 DIAGNOSIS — Z1211 Encounter for screening for malignant neoplasm of colon: Secondary | ICD-10-CM | POA: Diagnosis not present

## 2020-04-08 MED ORDER — SODIUM CHLORIDE 0.9 % IV SOLN: 500.0000 mL | Freq: Once | INTRAVENOUS | Status: DC

## 2020-04-08 NOTE — Patient Instructions (Signed)
YOU HAD AN ENDOSCOPIC PROCEDURE TODAY AT THE  ENDOSCOPY CENTER:   Refer to the procedure report that was given to you for any specific questions about what was found during the examination.  If the procedure report does not answer your questions, please call your gastroenterologist to clarify.  If you requested that your care partner not be given the details of your procedure findings, then the procedure report has been included in a sealed envelope for you to review at your convenience later.  YOU SHOULD EXPECT: Some feelings of bloating in the abdomen. Passage of more gas than usual.  Walking can help get rid of the air that was put into your GI tract during the procedure and reduce the bloating. If you had a lower endoscopy (such as a colonoscopy or flexible sigmoidoscopy) you may notice spotting of blood in your stool or on the toilet paper. If you underwent a bowel prep for your procedure, you may not have a normal bowel movement for a few days.  Please Note:  You might notice some irritation and congestion in your nose or some drainage.  This is from the oxygen used during your procedure.  There is no need for concern and it should clear up in a day or so.  SYMPTOMS TO REPORT IMMEDIATELY:   Following lower endoscopy (colonoscopy or flexible sigmoidoscopy):  Excessive amounts of blood in the stool  Significant tenderness or worsening of abdominal pains  Swelling of the abdomen that is new, acute  Fever of 100F or higher   Following upper endoscopy (EGD)  Vomiting of blood or coffee ground material  New chest pain or pain under the shoulder blades  Painful or persistently difficult swallowing  New shortness of breath  Fever of 100F or higher  Black, tarry-looking stools  For urgent or emergent issues, a gastroenterologist can be reached at any hour by calling [phone]. Do not use MyChart messaging for urgent concerns.    DIET:  We do recommend a small meal at first, but  then you may proceed to your regular diet.  Drink plenty of fluids but you should avoid alcoholic beverages for 24 hours.  ACTIVITY:  You should plan to take it easy for the rest of today and you should NOT DRIVE or use heavy machinery until tomorrow (because of the sedation medicines used during the test).    FOLLOW UP: Our staff will call the number listed on your records 48-72 hours following your procedure to check on you and address any questions or concerns that you may have regarding the information given to you following your procedure. If we do not reach you, we will leave a message.  We will attempt to reach you two times.  During this call, we will ask if you have developed any symptoms of COVID 19. If you develop any symptoms (ie: fever, flu-like symptoms, shortness of breath, cough etc.) before then, please call [phone].  If you test positive for Covid 19 in the 2 weeks post procedure, please call and report this information to us.    If any biopsies were taken you will be contacted by phone or by letter within the next 1-3 weeks.  Please call us at [phone] if you have not heard about the biopsies in 3 weeks.    SIGNATURES/CONFIDENTIALITY: You and/or your care partner have signed paperwork which will be entered into your electronic medical record.  These signatures attest to the fact that that the information above on   your After Visit Summary has been reviewed and is understood.  Full responsibility of the confidentiality of this discharge information lies with you and/or your care-partner. 

## 2020-04-08 NOTE — Progress Notes (Signed)
Called to room to assist during endoscopic procedure.  Patient ID and intended procedure confirmed with present staff. Received instructions for my participation in the procedure from the performing physician.  

## 2020-04-08 NOTE — Progress Notes (Signed)
Pt's states no medical or surgical changes since previsit or office visit.  Check-in-AER  Vital signs-JD

## 2020-04-08 NOTE — Op Note (Signed)
[facility] Patient Name: Ricky Tate Procedure Date: 04/08/2020 10:12 AM MRN: [id number] Endoscopist: Remo Lipps P. Havery Moros , MD Age: 70 Referring MD:  Date of Birth: 03/03/1950 Gender: Male Account #: [id number] Procedure:                Colonoscopy Indications:              High risk colon cancer surveillance: Personal                            history of colonic polyps (4 adenomas 10/2016,                            mother with colon cancer diagnosed age 66) Medicines:                Monitored Anesthesia Care Procedure:                Pre-Anesthesia Assessment:                           - Prior to the procedure, a History and Physical                            was performed, and patient medications and                            allergies were reviewed. The patient's tolerance of                            previous anesthesia was also reviewed. The risks                            and benefits of the procedure and the sedation                            options and risks were discussed with the patient.                            All questions were answered, and informed consent                            was obtained. Prior Anticoagulants: The patient has                            taken no previous anticoagulant or antiplatelet                            agents. ASA Grade Assessment: II - A patient with                            mild systemic disease. After reviewing the risks                            and benefits, the patient was deemed in  satisfactory condition to undergo the procedure.                           After obtaining informed consent, the colonoscope                            was passed under direct vision. Throughout the                            procedure, the patient's blood pressure, pulse, and                            oxygen saturations were monitored continuously. The                            Olympus CF-[id number]  ([id number]) Colonoscope was                            introduced through the anus and advanced to the the                            cecum, identified by appendiceal orifice and                            ileocecal valve. The colonoscopy was performed                            without difficulty. The patient tolerated the                            procedure well. The quality of the bowel                            preparation was good. The ileocecal valve,                            appendiceal orifice, and rectum were photographed. Scope In: 10:16:41 AM Scope Out: 10:33:50 AM Scope Withdrawal Time: 0 hours 13 minutes 51 seconds  Total Procedure Duration: 0 hours 17 minutes 9 seconds  Findings:                 The perianal and digital rectal examinations were                            normal.                           A 4 mm polyp was found in the splenic flexure. The                            polyp was flat. The polyp was removed with a cold                            snare. Resection and retrieval were complete.  A single small angiodysplastic lesion was found in                            the cecum.                           Internal hemorrhoids were found during retroflexion.                           A few small-mouthed diverticula were found in the                            sigmoid colon.                           The exam was otherwise without abnormality. Complications:            No immediate complications. Estimated blood loss:                            Minimal. Estimated Blood Loss:     Estimated blood loss was minimal. Impression:               - One 4 mm polyp at the splenic flexure, removed                            with a cold snare. Resected and retrieved.                           - A single colonic angiodysplastic lesion.                           - Internal hemorrhoids.                           - Mild diverticulosis in the sigmoid  colon.                           - The examination was otherwise normal. Recommendation:           - Patient has a contact number available for                            emergencies. The signs and symptoms of potential                            delayed complications were discussed with the                            patient. Return to normal activities tomorrow.                            Written discharge instructions were provided to the                            patient.                           -  Resume previous diet.                           - Continue present medications.                           - Await pathology results. Remo Lipps P. Dastan Krider, MD 04/08/2020 10:39:50 AM This report has been signed electronically.

## 2020-04-08 NOTE — Progress Notes (Signed)
To PACU, VSS. Report to [email or web address] 

## 2020-04-12 ENCOUNTER — Telehealth: Payer: Self-pay

## 2020-04-12 NOTE — Telephone Encounter (Signed)
  Follow up Call-  Call back number 04/08/2020  Post procedure Call Back phone  # [phone]  Permission to leave phone message Yes  Some recent data might be hidden     Patient questions:  Do you have a fever, pain , or abdominal swelling? No. Pain Score  0 *  Have you tolerated food without any problems? Yes.    Have you been able to return to your normal activities? Yes.    Do you have any questions about your discharge instructions: Diet   No. Medications  No. Follow up visit  No.  Do you have questions or concerns about your Care? No.  Actions: * If pain score is 4 or above: 1. No action needed, pain <4.Have you developed a fever since your procedure? no  2.   Have you had an respiratory symptoms (SOB or cough) since your procedure? no  3.   Have you tested positive for COVID 19 since your procedure no  4.   Have you had any family members/close contacts diagnosed with the COVID 19 since your procedure?  no   If yes to any of these questions please route to Joylene John, RN and Joella Prince, RN

## 2020-05-16 ENCOUNTER — Other Ambulatory Visit: Payer: Self-pay | Admitting: Internal Medicine

## 2020-08-12 ENCOUNTER — Other Ambulatory Visit: Payer: Self-pay | Admitting: Internal Medicine

## 2020-09-05 DIAGNOSIS — Z20822 Contact with and (suspected) exposure to covid-19: Secondary | ICD-10-CM | POA: Diagnosis not present

## 2020-09-07 DIAGNOSIS — H5203 Hypermetropia, bilateral: Secondary | ICD-10-CM | POA: Diagnosis not present

## 2020-09-07 DIAGNOSIS — H524 Presbyopia: Secondary | ICD-10-CM | POA: Diagnosis not present

## 2020-09-07 DIAGNOSIS — H259 Unspecified age-related cataract: Secondary | ICD-10-CM | POA: Diagnosis not present

## 2020-09-07 DIAGNOSIS — H52223 Regular astigmatism, bilateral: Secondary | ICD-10-CM | POA: Diagnosis not present

## 2020-11-12 ENCOUNTER — Other Ambulatory Visit: Payer: Self-pay | Admitting: Internal Medicine

## 2020-11-14 ENCOUNTER — Other Ambulatory Visit: Payer: Self-pay | Admitting: Internal Medicine

## 2020-12-08 DIAGNOSIS — Z23 Encounter for immunization: Secondary | ICD-10-CM | POA: Diagnosis not present

## 2020-12-15 ENCOUNTER — Other Ambulatory Visit: Payer: Self-pay | Admitting: Internal Medicine

## 2020-12-19 ENCOUNTER — Other Ambulatory Visit: Payer: Self-pay | Admitting: Internal Medicine

## 2021-01-15 ENCOUNTER — Other Ambulatory Visit: Payer: Self-pay | Admitting: Internal Medicine

## 2021-02-21 NOTE — Progress Notes (Signed)
Subjective:    Patient ID: Ricky Tate, male    DOB: 06/01/1950, 71 y.o.   MRN: [id number]  This visit occurred during the SARS-CoV-2 public health emergency.  Safety protocols were in place, including screening questions prior to the visit, additional usage of staff PPE, and extensive cleaning of exam room while observing appropriate contact time as indicated for disinfecting solutions.     HPI The patient is here for follow up of their chronic medical problems, including htn  Walking at least 30 min daily also doing weights and yoga.   Overall doing well and has no concerns.  He does check his blood pressure at home periodically and it has been good-he thinks it is just high here today.  Medications and allergies reviewed with patient and updated if appropriate.  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     LYSINE PO Take 1 tablet by mouth daily at 6 (six) AM.     Multiple Vitamin (MULTIVITAMIN PO) Take 1 tablet by mouth daily at 6 (six) AM.     propranolol (INDERAL) 20 MG tablet TAKE 1 TABLET(20 MG) BY MOUTH TWICE DAILY. Follow up appt needed for additional refills 60 tablet 0   Psyllium (WAL-MUCIL PO) Take 1 tablet by mouth in the morning, at noon, in the evening, and at bedtime.     No current  facility-administered medications on file prior to visit.    Past Medical History:  Diagnosis Date   Benign prostatic hypertrophy    Cancer ([place])    skin,squamous cell..[facility]h of ([facility])   Cataract    bialteral-not a surgical candidate at this time (03/24/2020)   Gilbert's syndrome    Hyperlipidemia    NMR:LDL 024(0973/532) HDL 77,TG 61 - diet controlled   Hyperplastic colon polyp    pmh   Hypertension    on meds   Syncope    vaso vagal     Past Surgical History:  Procedure Laterality Date   COLONOSCOPY  05/2011    Dr Olevia Perches   COLONOSCOPY  2018   [facility]-good prep-TICS/hems/HPP x4/multiple frags fo TA/tubulovillous adenomas   COLONOSCOPY W/ POLYPECTOMY  2008   VASECTOMY      Social History   Socioeconomic History   Marital status: Married    Spouse name: Not on file   Number of children: Not on file   Years of education: Not on file   Highest education level: Not on file  Occupational History   Occupation: Teacher, adult education [employer]    Employer: [employer]  Tobacco Use   Smoking status: Never   Smokeless tobacco: Never  Vaping Use   Vaping Use: Never used  Substance and Sexual Activity   Alcohol use: Not Currently   Drug use: No   Sexual activity: Not on  file  Other Topics Concern   Not on file  Social History Narrative   Regular exercise   Social Determinants of Health   Financial Resource Strain: Low Risk    Difficulty of Paying Living Expenses: Not hard at all  Food Insecurity: No Food Insecurity   Worried About Charity fundraiser in the Last Year: Never true   [place] in the Last Year: Never true  Transportation Needs: No Transportation Needs   Lack of Transportation (Medical): No   Lack of Transportation (Non-Medical): No  Physical Activity: Sufficiently Active   Days of Exercise per Week: 5 days   Minutes of Exercise per Session: 30 min  Stress: No Stress Concern Present   Feeling of Stress : Not at all  Social  Connections: Socially Integrated   Frequency of Communication with Friends and Family: More than three times a week   Frequency of Social Gatherings with Friends and Family: More than three times a week   Attends Religious Services: 1 to 4 times per year   Active Member of [employer] or Organizations: Yes   Attends Archivist Meetings: 1 to 4 times per year   Marital Status: Married    Family History  Problem Relation Age of Onset   Alzheimer's disease Mother    Colon cancer Mother 34   Lung cancer Father        smoker   Alzheimer's disease Maternal Aunt        x2 aunts.x 2   Alzheimer's disease Maternal Uncle        x 2   Heart disease Paternal Uncle        CABG,CAD   Diabetes Paternal Uncle    Hypertension Maternal Grandmother    Hypertension Maternal Grandfather    Stroke Maternal Grandfather 80   Hypertension Paternal Grandmother    Stroke Paternal Grandmother        in 38s   Hypertension Paternal Grandfather    Stroke Paternal Grandfather        late 55s   Schizophrenia Other        aunt   Diabetes Paternal Aunt    Stomach cancer Neg Hx    Heart attack Neg Hx    Colon polyps Neg Hx    Rectal cancer Neg Hx     Review of Systems  Constitutional:  Negative for chills and fever.  Respiratory:  Positive for shortness of breath (a little with inclines / stairs). Negative for cough and wheezing.   Cardiovascular:  Negative for chest pain, palpitations and leg swelling.  Gastrointestinal:  Negative for abdominal pain, blood in stool, constipation, diarrhea and nausea.       Occ gerd/ hiccups - once every couple of weeks.   Genitourinary:  Negative for difficulty urinating and dysuria.  Musculoskeletal:  Positive for back pain (mild - chronic). Negative for arthralgias.  Neurological:  Negative for light-headedness and headaches.  Psychiatric/Behavioral:  Negative for dysphoric mood. The patient is not nervous/anxious.       Objective:   Vitals:   02/22/21 0945   BP: (!) 142/88  Pulse: 73  Temp: 97.9 F (36.6 C)  SpO2: 98%   BP Readings from Last 3 Encounters:  02/22/21 (!) 142/88  04/08/20 111/77  03/03/20 120/70   Wt Readings from Last 3 Encounters:  02/22/21 185 lb 12.8 oz (84.3 kg)  04/08/20 186 lb (84.4 kg)  03/24/20 186 lb (84.4 kg)   Body mass index is 27.44  kg/m.   Physical Exam    Constitutional: Appears well-developed and well-nourished. No distress.  HENT:  Head: Normocephalic and atraumatic.  Neck: Neck supple. No tracheal deviation present. No thyromegaly present.  No cervical lymphadenopathy Cardiovascular: Normal rate, regular rhythm and normal heart sounds.   1/6 systolic murmur heard. No carotid bruit .  No edema Pulmonary/Chest: Effort normal and breath sounds normal. No respiratory distress. No has no wheezes. No rales. Abdomen: Soft, nontender, nondistended Skin: Skin is warm and dry. Not diaphoretic.  Psychiatric: Normal mood and affect. Behavior is normal.      Assessment & Plan:    See Problem List for Assessment and Plan of chronic medical problems.

## 2021-02-21 NOTE — Patient Instructions (Addendum)
° ° °  Blood work was ordered.    Monitor your BP at home - less than 140/90.   Medications changes include :   none  Your prescription(s) have been submitted to your pharmacy. Please take as directed and contact our office if you believe you are having problem(s) with the medication(s).   A echocardiogram was ordered.  Someone from their office will call you to schedule an appointment.    Please followup in 1 year

## 2021-02-22 ENCOUNTER — Ambulatory Visit (INDEPENDENT_AMBULATORY_CARE_PROVIDER_SITE_OTHER): Payer: Medicare Other | Admitting: Internal Medicine

## 2021-02-22 ENCOUNTER — Encounter: Payer: Self-pay | Admitting: Internal Medicine

## 2021-02-22 ENCOUNTER — Other Ambulatory Visit: Payer: Self-pay

## 2021-02-22 VITALS — BP 142/88 | HR 73 | Temp 97.9°F | Ht 69.0 in | Wt 185.8 lb

## 2021-02-22 DIAGNOSIS — I10 Essential (primary) hypertension: Secondary | ICD-10-CM

## 2021-02-22 DIAGNOSIS — R7303 Prediabetes: Secondary | ICD-10-CM | POA: Diagnosis not present

## 2021-02-22 DIAGNOSIS — Z125 Encounter for screening for malignant neoplasm of prostate: Secondary | ICD-10-CM | POA: Diagnosis not present

## 2021-02-22 DIAGNOSIS — R011 Cardiac murmur, unspecified: Secondary | ICD-10-CM

## 2021-02-22 DIAGNOSIS — I77810 Thoracic aortic ectasia: Secondary | ICD-10-CM

## 2021-02-22 DIAGNOSIS — G252 Other specified forms of tremor: Secondary | ICD-10-CM | POA: Diagnosis not present

## 2021-02-22 DIAGNOSIS — E782 Mixed hyperlipidemia: Secondary | ICD-10-CM

## 2021-02-22 LAB — CBC WITH DIFFERENTIAL/PLATELET
Basophils Absolute: 0 10*3/uL (ref 0.0–0.1)
Basophils Relative: 1 % (ref 0.0–3.0)
Eosinophils Absolute: 0.2 10*3/uL (ref 0.0–0.7)
Eosinophils Relative: 5 % (ref 0.0–5.0)
HCT: 44 % (ref 39.0–52.0)
Hemoglobin: 15.1 g/dL (ref 13.0–17.0)
Lymphocytes Relative: 20.4 % (ref 12.0–46.0)
Lymphs Abs: 0.9 10*3/uL (ref 0.7–4.0)
MCHC: 34.3 g/dL (ref 30.0–36.0)
MCV: 91.8 fl (ref 78.0–100.0)
Monocytes Absolute: 0.3 10*3/uL (ref 0.1–1.0)
Monocytes Relative: 5.8 % (ref 3.0–12.0)
Neutro Abs: 3.1 10*3/uL (ref 1.4–7.7)
Neutrophils Relative %: 67.8 % (ref 43.0–77.0)
Platelets: 232 10*3/uL (ref 150.0–400.0)
RBC: 4.8 Mil/uL (ref 4.22–5.81)
RDW: 12.8 % (ref 11.5–15.5)
WBC: 4.6 10*3/uL (ref 4.0–10.5)

## 2021-02-22 LAB — COMPREHENSIVE METABOLIC PANEL
ALT: 22 U/L (ref 0–53)
AST: 30 U/L (ref 0–37)
Albumin: 4.5 g/dL (ref 3.5–5.2)
Alkaline Phosphatase: 56 U/L (ref 39–117)
BUN: 11 mg/dL (ref 6–23)
CO2: 30 mEq/L (ref 19–32)
Calcium: 9.6 mg/dL (ref 8.4–10.5)
Chloride: 100 mEq/L (ref 96–112)
Creatinine, Ser: 0.73 mg/dL (ref 0.40–1.50)
GFR: 92.86 mL/min (ref >60.00)
Glucose, Bld: 92 mg/dL (ref 70–99)
Potassium: 4.2 mEq/L (ref 3.5–5.1)
Sodium: 138 mEq/L (ref 135–145)
Total Bilirubin: 1.2 mg/dL (ref 0.2–1.2)
Total Protein: 7.1 g/dL (ref 6.0–8.3)

## 2021-02-22 LAB — HEMOGLOBIN A1C: Hgb A1c MFr Bld: 5.6 % (ref 4.6–6.5)

## 2021-02-22 LAB — LIPID PANEL
Cholesterol: 200 mg/dL (ref 0–200)
HDL: 62.9 mg/dL (ref >39.00)
LDL Cholesterol: 110 mg/dL — ABNORMAL HIGH (ref 0–99)
NonHDL: 137.55
Total CHOL/HDL Ratio: 3
Triglycerides: 138 mg/dL (ref 0.0–149.0)
VLDL: 27.6 mg/dL (ref 0.0–40.0)

## 2021-02-22 LAB — PSA, MEDICARE: PSA: 1.28 ng/ml (ref 0.10–4.00)

## 2021-02-22 NOTE — Assessment & Plan Note (Signed)
PSA

## 2021-02-22 NOTE — Assessment & Plan Note (Signed)
Chronic Regular exercise and healthy diet encouraged Check lipid panel  Diet controlled

## 2021-02-22 NOTE — Assessment & Plan Note (Signed)
Chronic Mild dilation of aorta seen on CT scan 05/2019 Will check echocardiogram

## 2021-02-22 NOTE — Assessment & Plan Note (Signed)
Chronic Controlled, Stable Continue propranolol 20 mg twice daily

## 2021-02-22 NOTE — Assessment & Plan Note (Signed)
Chronic Blood pressure well controlled at home, but elevated here today Advised that he monitor his blood pressure frequently for the next 2 weeks to make sure it is consistently less than 140/90 CMP Continue propranolol 20 mg twice daily

## 2021-02-22 NOTE — Assessment & Plan Note (Signed)
Chronic Check a1c Low sugar / carb diet Stressed regular exercise  

## 2021-03-06 ENCOUNTER — Ambulatory Visit (INDEPENDENT_AMBULATORY_CARE_PROVIDER_SITE_OTHER): Payer: Medicare Other

## 2021-03-06 ENCOUNTER — Other Ambulatory Visit: Payer: Self-pay

## 2021-03-06 DIAGNOSIS — Z0000 Encounter for general adult medical examination without abnormal findings: Secondary | ICD-10-CM

## 2021-03-06 NOTE — Patient Instructions (Signed)
Ricky Tate , Thank you for taking time to come for your Medicare Wellness Visit. I appreciate your ongoing commitment to your health goals. Please review the following plan we discussed and let me know if I can assist you in the future.   Screening recommendations/referrals: Colonoscopy: 04/08/2020; due every 5 years Recommended yearly ophthalmology/optometry visit for glaucoma screening and checkup Recommended yearly dental visit for hygiene and checkup  Vaccinations: Influenza vaccine: 11/29/2020 Pneumococcal vaccine: 05/19/2019, 03/03/2020 Tdap vaccine: due Shingles vaccine: due   Covid-19: 03/07/2019, 03/30/2019, 12/02/2019  Advanced directives: Please bring a copy of your health care power of attorney and living will to the office at your convenience.  Conditions/risks identified: Yes; Patient stated that his goal is to lose 15 pounds.  Next appointment: Please schedule your next Medicare Wellness Visit with your Nurse Health Advisor in 1 year by calling [phone].  Preventive Care 70 Years and Older, Male Preventive care refers to lifestyle choices and visits with your health care provider that can promote health and wellness. What does preventive care include? A yearly physical exam. This is also called an annual well check. Dental exams once or twice a year. Routine eye exams. Ask your health care provider how often you should have your eyes checked. Personal lifestyle choices, including: Daily care of your teeth and gums. Regular physical activity. Eating a healthy diet. Avoiding tobacco and drug use. Limiting alcohol use. Practicing safe sex. Taking low doses of aspirin every day. Taking vitamin and mineral supplements as recommended by your health care provider. What happens during an annual well check? The services and screenings done by your health care provider during your annual well check will depend on your age, overall health, lifestyle risk factors, and family  history of disease. Counseling  Your health care provider may ask you questions about your: Alcohol use. Tobacco use. Drug use. Emotional well-being. Home and relationship well-being. Sexual activity. Eating habits. History of falls. Memory and ability to understand (cognition). Work and work Statistician. Screening  You may have the following tests or measurements: Height, weight, and BMI. Blood pressure. Lipid and cholesterol levels. These may be checked every 5 years, or more frequently if you are over 70 years old. Skin check. Lung cancer screening. You may have this screening every year starting at age 70 if you have a 30-pack-year history of smoking and currently smoke or have quit within the past 15 years. Fecal occult blood test (FOBT) of the stool. You may have this test every year starting at age 70. Flexible sigmoidoscopy or colonoscopy. You may have a sigmoidoscopy every 5 years or a colonoscopy every 10 years starting at age 70. Prostate cancer screening. Recommendations will vary depending on your family history and other risks. Hepatitis C blood test. Hepatitis B blood test. Sexually transmitted disease (STD) testing. Diabetes screening. This is done by checking your blood sugar (glucose) after you have not eaten for a while (fasting). You may have this done every 1-3 years. Abdominal aortic aneurysm (AAA) screening. You may need this if you are a current or former smoker. Osteoporosis. You may be screened starting at age 70 if you are at high risk. Talk with your health care provider about your test results, treatment options, and if necessary, the need for more tests. Vaccines  Your health care provider may recommend certain vaccines, such as: Influenza vaccine. This is recommended every year. Tetanus, diphtheria, and acellular pertussis (Tdap, Td) vaccine. You may need a Td booster every 10 years. Zoster  vaccine. You may need this after age 70. Pneumococcal  13-valent conjugate (PCV13) vaccine. One dose is recommended after age 70. Pneumococcal polysaccharide (PPSV23) vaccine. One dose is recommended after age 70. Talk to your health care provider about which screenings and vaccines you need and how often you need them. This information is not intended to replace advice given to you by your health care provider. Make sure you discuss any questions you have with your health care provider. Document Released: 02/04/2015 Document Revised: 09/28/2015 Document Reviewed: 11/09/2014 Elsevier Interactive Patient Education  2017 [place] Prevention in the Home Falls can cause injuries. They can happen to people of all ages. There are many things you can do to make your home safe and to help prevent falls. What can I do on the outside of my home? Regularly fix the edges of walkways and driveways and fix any cracks. Remove anything that might make you trip as you walk through a door, such as a raised step or threshold. Trim any bushes or trees on the path to your home. Use bright outdoor lighting. Clear any walking paths of anything that might make someone trip, such as rocks or tools. Regularly check to see if handrails are loose or broken. Make sure that both sides of any steps have handrails. Any raised decks and porches should have guardrails on the edges. Have any leaves, snow, or ice cleared regularly. Use sand or salt on walking paths during winter. Clean up any spills in your garage right away. This includes oil or grease spills. What can I do in the bathroom? Use night lights. Install grab bars by the toilet and in the tub and shower. Do not use towel bars as grab bars. Use non-skid mats or decals in the tub or shower. If you need to sit down in the shower, use a plastic, non-slip stool. Keep the floor dry. Clean up any water that spills on the floor as soon as it happens. Remove soap buildup in the tub or shower regularly. Attach  bath mats securely with double-sided non-slip rug tape. Do not have throw rugs and other things on the floor that can make you trip. What can I do in the bedroom? Use night lights. Make sure that you have a light by your bed that is easy to reach. Do not use any sheets or blankets that are too big for your bed. They should not hang down onto the floor. Have a firm chair that has side arms. You can use this for support while you get dressed. Do not have throw rugs and other things on the floor that can make you trip. What can I do in the kitchen? Clean up any spills right away. Avoid walking on wet floors. Keep items that you use a lot in easy-to-reach places. If you need to reach something above you, use a strong step stool that has a grab bar. Keep electrical cords out of the way. Do not use floor polish or wax that makes floors slippery. If you must use wax, use non-skid floor wax. Do not have throw rugs and other things on the floor that can make you trip. What can I do with my stairs? Do not leave any items on the stairs. Make sure that there are handrails on both sides of the stairs and use them. Fix handrails that are broken or loose. Make sure that handrails are as long as the stairways. Check any carpeting to make sure that it  is firmly attached to the stairs. Fix any carpet that is loose or worn. Avoid having throw rugs at the top or bottom of the stairs. If you do have throw rugs, attach them to the floor with carpet tape. Make sure that you have a light switch at the top of the stairs and the bottom of the stairs. If you do not have them, ask someone to add them for you. What else can I do to help prevent falls? Wear shoes that: Do not have high heels. Have rubber bottoms. Are comfortable and fit you well. Are closed at the toe. Do not wear sandals. If you use a stepladder: Make sure that it is fully opened. Do not climb a closed stepladder. Make sure that both sides of the  stepladder are locked into place. Ask someone to hold it for you, if possible. Clearly mark and make sure that you can see: Any grab bars or handrails. First and last steps. Where the edge of each step is. Use tools that help you move around (mobility aids) if they are needed. These include: Canes. Walkers. Scooters. Crutches. Turn on the lights when you go into a dark area. Replace any light bulbs as soon as they burn out. Set up your furniture so you have a clear path. Avoid moving your furniture around. If any of your floors are uneven, fix them. If there are any pets around you, be aware of where they are. Review your medicines with your doctor. Some medicines can make you feel dizzy. This can increase your chance of falling. Ask your doctor what other things that you can do to help prevent falls. This information is not intended to replace advice given to you by your health care provider. Make sure you discuss any questions you have with your health care provider. Document Released: 11/04/2008 Document Revised: 06/16/2015 Document Reviewed: 02/12/2014 Elsevier Interactive Patient Education  2017 [employer].

## 2021-03-06 NOTE — Progress Notes (Signed)
I connected with Ricky Tate today by telephone and verified that I am speaking with the correct person using two identifiers. Location patient: home Location provider: work Persons participating in the virtual visit: patient, provider.   I discussed the limitations, risks, security and privacy concerns of performing an evaluation and management service by telephone and the availability of in person appointments. I also discussed with the patient that there may be a patient responsible charge related to this service. The patient expressed understanding and verbally consented to this telephonic visit.    Interactive audio and video telecommunications were attempted between this provider and patient, however failed, due to patient having technical difficulties OR patient did not have access to video capability.  We continued and completed visit with audio only.  Some vital signs may be absent or patient reported.   Time Spent with patient on telephone encounter: 30 minutes  Subjective:   Ricky Tate is a 71 y.o. male who presents for Medicare Annual/Subsequent preventive examination.  Review of Systems     Cardiac Risk Factors include: advanced age (>8mn, >>12women);dyslipidemia;family history of premature cardiovascular disease;hypertension;male gender     Objective:    There were no vitals filed for this visit. There is no height or weight on file to calculate BMI.  Advanced Directives 03/06/2021 03/03/2020 11/07/2016  Does Patient Have a Medical Advance Directive? Yes Yes Yes  Type of Advance Directive Living will;[place]care Power of [place];Healthcare Power of Attorney  Does patient want to make changes to medical advance directive? No - Patient declined No - Patient declined -  [place]? No - copy requested No - copy requested -    Current Medications (verified) Outpatient  Encounter Medications as of 03/06/2021  Medication Sig   ASTRAGALUS PO Take 1 tablet by mouth daily at 6 (six) AM.   LYSINE PO Take 1 tablet by mouth daily at 6 (six) AM.   Multiple Vitamin (MULTIVITAMIN PO) Take 1 tablet by mouth daily at 6 (six) AM.   propranolol (INDERAL) 20 MG tablet TAKE 1 TABLET(20 MG) BY MOUTH TWICE DAILY. Follow up appt needed for additional refills   Psyllium (WAL-MUCIL PO) Take 1 tablet by mouth in the morning, at noon, in the evening, and at bedtime.   No facility-administered encounter medications on file as of 03/06/2021.    Allergies (verified) Patient has no known allergies.   History: Past Medical History:  Diagnosis Date   Benign prostatic hypertrophy    [place]    skin,squamous cell..pmh of ([facility])   Cataract    bialteral-not a surgical candidate at this time (03/24/2020)   Gilbert's syndrome    Hyperlipidemia    NMR:LDL 1892(1194/174 HDL 77,TG 61 - diet controlled   Hyperplastic colon polyp    pmh   Hypertension    on meds   Syncope    vaso vagal    Past Surgical History:  Procedure Laterality Date   COLONOSCOPY  05/2011    Dr BOlevia Perches  COLONOSCOPY  2018   SA-MAC-good prep-TICS/hems/HPP x4/multiple frags fo TA/tubulovillous adenomas   COLONOSCOPY W/ POLYPECTOMY  2008   VASECTOMY     Family History  Problem Relation Age of Onset   Alzheimer's disease Mother    Colon cancer Mother 58  Lung cancer Father        smoker   Alzheimer's disease Maternal Aunt        x2  aunts.x 2   Alzheimer's disease Maternal Uncle        x 2   Heart disease Paternal Uncle        CABG,CAD   Diabetes Paternal Uncle    Hypertension Maternal Grandmother    Hypertension Maternal Grandfather    Stroke Maternal Grandfather 17   Hypertension Paternal Grandmother    Stroke Paternal Grandmother        in 23s   Hypertension Paternal Grandfather    Stroke Paternal Grandfather        late 98s   Schizophrenia Other        aunt   Diabetes Paternal Aunt     Stomach cancer Neg Hx    Heart attack Neg Hx    Colon polyps Neg Hx    Rectal cancer Neg Hx    Social History   Socioeconomic History   Marital status: Married    Spouse name: Not on file   Number of children: Not on file   Years of education: Not on file   Highest education level: Not on file  Occupational History   Occupation: Chief Executive Officer: [employer]  Tobacco Use   Smoking status: Never   Smokeless tobacco: Never  Vaping Use   Vaping Use: Never used  Substance and Sexual Activity   Alcohol use: Not Currently   Drug use: No   Sexual activity: Not on file  Other Topics Concern   Not on file  Social History Narrative   Regular exercise   Social Determinants of Health   Financial Resource Strain: Low Risk    Difficulty of Paying Living Expenses: Not hard at all  Food Insecurity: No Food Insecurity   Worried About Charity fundraiser in the Last Year: Never true   [place] in the Last Year: Never true  Transportation Needs: No Transportation Needs   Lack of Transportation (Medical): No   Lack of Transportation (Non-Medical): No  Physical Activity: Sufficiently Active   Days of Exercise per Week: 5 days   Minutes of Exercise per Session: 30 min  Stress: No Stress Concern Present   Feeling of Stress : Not at all  Social Connections: Socially Integrated   Frequency of Communication with Friends and Family: More than three times a week   Frequency of Social Gatherings with Friends and Family: More than three times a week   Attends Religious Services: 1 to 4 times per year   Active Member of [employer] or Organizations: Yes   Attends Archivist Meetings: 1 to 4 times per year   Marital Status: Married    Tobacco Counseling Counseling given: Not Answered   Clinical Intake:  Pre-visit preparation completed: Yes  Pain : No/denies pain     Nutritional Risks: None Diabetes: No  How often do you need to  have someone help you when you read instructions, pamphlets, or other written materials from your doctor or pharmacy?: 1 - Never What is the last grade level you completed in school?: Bachelor's Degree  Diabetic? no  Interpreter Needed?: No  Information entered by :: Lisette Abu, LPN   Activities of Daily Living In your present state of health, do you have any difficulty performing the following activities: 03/06/2021  Hearing? N  Vision? N  Difficulty concentrating or making decisions? N  Walking or climbing stairs? N  Dressing or bathing? N  Doing errands, shopping? N  Preparing Food and eating ?  N  Using the Toilet? N  In the past six months, have you accidently leaked urine? N  Do you have problems with loss of bowel control? N  Managing your Medications? N  Managing your Finances? N  Housekeeping or managing your Housekeeping? N  Some recent data might be hidden    Patient Care Team: Binnie Rail, MD as PCP - General (Internal Medicine) Armbruster, Carlota Raspberry, MD as Consulting Physician (Gastroenterology)  Indicate any recent Medical Services you may have received from other than [facility] providers in the past year (date may be approximate).     Assessment:   This is a routine wellness examination for Ludwig.  Hearing/Vision screen Hearing Screening - Comments:: Patient denied any hearing difficulty.   No hearing aids.  Vision Screening - Comments:: Patient wears corrective glasses/contacts.  Eye exam done annually by:   Dietary issues and exercise activities discussed: Current Exercise Habits: Home exercise routine;Structured exercise class, Type of exercise: walking;treadmill;stretching;strength training/weights, Time (Minutes): 30, Frequency (Times/Week): 5, Weekly Exercise (Minutes/Week): 150, Intensity: Moderate, Exercise limited by: None identified   Goals Addressed               This Visit's Progress     Patient Stated (pt-stated)        My goal is  to lose 15 pounds this year.      Depression Screen PHQ 2/9 Scores 03/06/2021 03/03/2020 05/19/2019 02/18/2018 09/18/2016  PHQ - 2 Score 0 0 0 0 0    Fall Risk Fall Risk  03/06/2021 03/03/2020 05/19/2019 02/18/2018 09/18/2016  Falls in the past year? 0 0 0 0 No  Number falls in past yr: 0 0 0 - -  Injury with Fall? 0 0 0 - -  Risk for fall due to : No Fall Risks No Fall Risks - - -  Follow up Falls evaluation completed - Falls evaluation completed - -    FALL RISK PREVENTION PERTAINING TO THE HOME:  Any stairs in or around the home? Yes  If so, are there any without handrails? No  Home free of loose throw rugs in walkways, pet beds, electrical cords, etc? Yes  Adequate lighting in your home to reduce risk of falls? Yes   ASSISTIVE DEVICES UTILIZED TO PREVENT FALLS:  Life alert? No  Use of a cane, walker or w/c? No  Grab bars in the bathroom? No  Shower chair or bench in shower? No  Elevated toilet seat or a handicapped toilet? No   TIMED UP AND GO:  Was the test performed? No .  Length of time to ambulate 10 feet: n/a sec.   Gait steady and fast without use of assistive device  Cognitive Function: Normal cognitive status assessed by direct observation by this Nurse Health Advisor. No abnormalities found.          Immunizations Immunization History  Administered Date(s) Administered   Fluad Quad(high Dose 65+) 11/08/2018   Influenza, High Dose Seasonal PF 02/18/2018   Influenza,inj,Quad PF,6+ Mos 11/18/2013   Influenza-Unspecified 10/24/2016, 12/02/2019, 11/29/2020   PFIZER(Purple Top)SARS-COV-2 Vaccination 03/07/2019, 03/30/2019, 12/02/2019   Pneumococcal Conjugate-13 05/19/2019   Pneumococcal Polysaccharide-23 03/03/2020   Td 01/31/2010   Zoster, Live 12/22/2013    TDAP status: Due, Education has been provided regarding the importance of this vaccine. Advised may receive this vaccine at local pharmacy or Health Dept. Aware to provide a copy of the vaccination record  if obtained from local pharmacy or Health Dept. Verbalized acceptance and understanding.  Flu  Vaccine status: Up to date  Pneumococcal vaccine status: Up to date  Covid-19 vaccine status: Completed vaccines  Qualifies for Shingles Vaccine? Yes   Zostavax completed Yes   Shingrix Completed?: No.    Education has been provided regarding the importance of this vaccine. Patient has been advised to call insurance company to determine out of pocket expense if they have not yet received this vaccine. Advised may also receive vaccine at local pharmacy or Health Dept. Verbalized acceptance and understanding.  Screening Tests Health Maintenance  Topic Date Due   Zoster Vaccines- Shingrix (1 of 2) Never done   COVID-19 Vaccine (4 - Booster for Pfizer series) 03/10/2021 (Originally 01/27/2020)   TETANUS/TDAP  02/22/2022 (Originally 02/01/2020)   COLONOSCOPY (Pts 45-29yr Insurance coverage will need to be confirmed)  04/08/2025   Pneumonia Vaccine 70 Years old  Completed   INFLUENZA VACCINE  Completed   Hepatitis C Screening  Completed   HPV VACCINES  Aged Out    Health Maintenance  Health Maintenance Due  Topic Date Due   Zoster Vaccines- Shingrix (1 of 2) Never done    Colorectal cancer screening: Type of screening: Colonoscopy. Completed 04/08/2020. Repeat every 5 years  Lung Cancer Screening: (Low Dose CT Chest recommended if Age 70-80years, 30 pack-year currently smoking OR have quit w/in 15years.) does not qualify.   Lung Cancer Screening Referral: no  Additional Screening:  Hepatitis C Screening: does qualify; Completed yes  Vision Screening: Recommended annual ophthalmology exams for early detection of glaucoma and other disorders of the eye. Is the patient up to date with their annual eye exam?  Yes  Who is the provider or what is the name of the office in which the patient attends annual eye exams[facility] a provider, would they  like to be referred to a provider to establish care? No .   Dental Screening: Recommended annual dental exams for proper oral hygiene  Community Resource Referral / Chronic Care Management: CRR required this visit?  No   CCM required this visit?  No      Plan:     I have personally reviewed and noted the following in the patients chart:   Medical and social history Use of alcohol, tobacco or illicit drugs  Current medications and supplements including opioid prescriptions. Patient is not currently taking opioid prescriptions. Functional ability and status Nutritional status Physical activity Advanced directives List of other physicians Hospitalizations, surgeries, and ER visits in previous 12 months Vitals Screenings to include cognitive, depression, and falls Referrals and appointments  In addition, I have reviewed and discussed with patient certain preventive protocols, quality metrics, and best practice recommendations. A written personalized care plan for preventive services as well as general preventive health recommendations were provided to patient.     SSheral Flow LPN   [id number]  Nurse Notes:  Patient is cogitatively intact. There were no vitals filed for this visit. There is no height or weight on file to calculate BMI.

## 2021-03-07 ENCOUNTER — Ambulatory Visit (INDEPENDENT_AMBULATORY_CARE_PROVIDER_SITE_OTHER): Payer: Medicare Other

## 2021-03-07 DIAGNOSIS — R011 Cardiac murmur, unspecified: Secondary | ICD-10-CM

## 2021-03-07 DIAGNOSIS — I77810 Thoracic aortic ectasia: Secondary | ICD-10-CM | POA: Diagnosis not present

## 2021-03-08 LAB — ECHOCARDIOGRAM COMPLETE
AR max vel: 2.36 cm2
AV Area VTI: 2.28 cm2
AV Area mean vel: 2.39 cm2
AV Mean grad: 2.5 mmHg
AV Peak grad: 4.8 mmHg
Ao pk vel: 1.1 m/s
Area-P 1/2: 3.4 cm2
Calc EF: 66.8 %
S' Lateral: 3.05 cm
Single Plane A2C EF: 61.3 %
Single Plane A4C EF: 68.8 %

## 2021-04-15 ENCOUNTER — Other Ambulatory Visit: Payer: Self-pay | Admitting: Internal Medicine

## 2021-04-17 ENCOUNTER — Other Ambulatory Visit: Payer: Self-pay | Admitting: Internal Medicine

## 2021-04-18 ENCOUNTER — Other Ambulatory Visit: Payer: Self-pay | Admitting: Internal Medicine

## 2021-04-27 ENCOUNTER — Encounter: Payer: Self-pay | Admitting: Internal Medicine

## 2021-04-27 ENCOUNTER — Other Ambulatory Visit: Payer: Self-pay

## 2021-04-27 MED ORDER — PROPRANOLOL HCL 20 MG PO TABS: 20.0000 mg | ORAL_TABLET | Freq: Two times a day (BID) | ORAL | 2 refills | Status: DC

## 2021-06-06 ENCOUNTER — Encounter: Payer: Self-pay | Admitting: Internal Medicine

## 2021-06-09 IMAGING — CT CT CARDIAC CORONARY ARTERY CALCIUM SCORE
3 series · 14 of 20 positions shown, 15 images · non-contrast
Comparison: None.
COMPARISON: None.

Addendum:
EXAM:
OVER-READ INTERPRETATION  CT CHEST

The following report is an over-read performed by radiologist Dr.
Evangelista Reynaldo Bouet [REDACTED] on 05/29/2019. This over-read
does not include interpretation of cardiac or coronary anatomy or
pathology. The calcium score interpretation by the cardiologist is
attached.
CLINICAL DATA: 68M for risk stratification
Coronary Calcium Score
TECHNIQUE: The patient was scanned on a Siemens Force scanner. Axial
non-contrast 3 mm slices were carried out through the heart. The
data set was analyzed on a dedicated work station and scored using
the Agatson method.

[Series 2: casc 3.0 bv41 2 bestdiast 71 % · axial · 0.40mm/px · z∈[-256,-181]mm · 4 of 43 slices shown, 5 images]
[im 9/43  vessel]
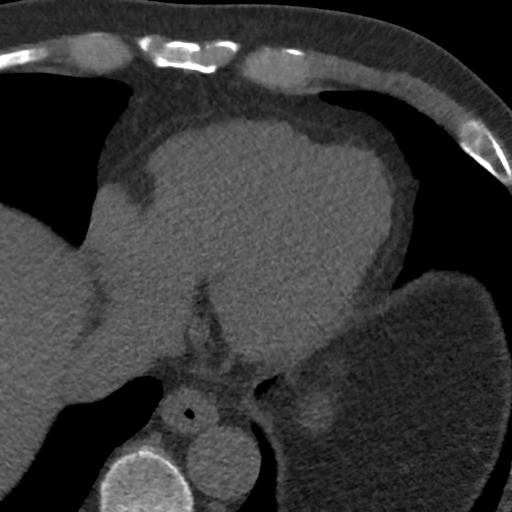
[im 9/43  lung]
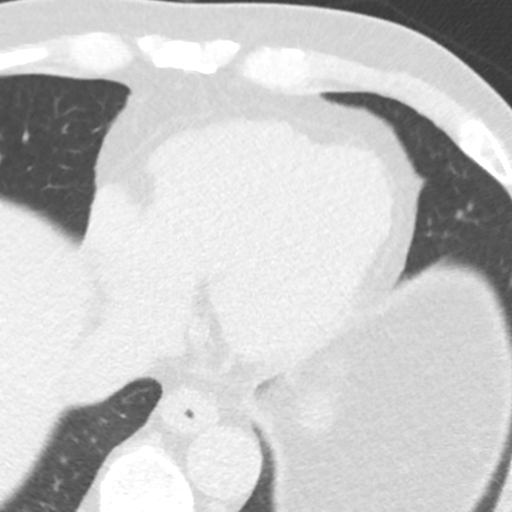
[im 17/43  vessel]
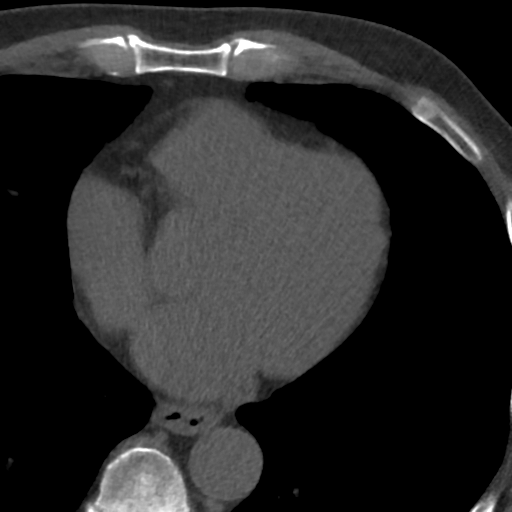
[im 26/43  vessel]
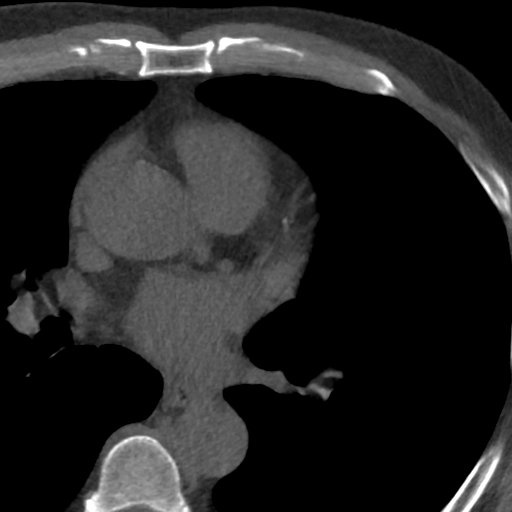
[im 34/43  vessel]
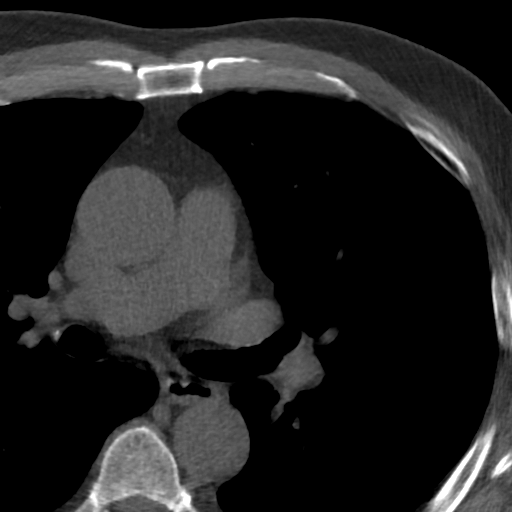

[Series 3: lung 70 % · axial · 0.68mm/px · z∈[-258,-174]mm · 5 of 43 slices shown]
[im 8/43  lung]
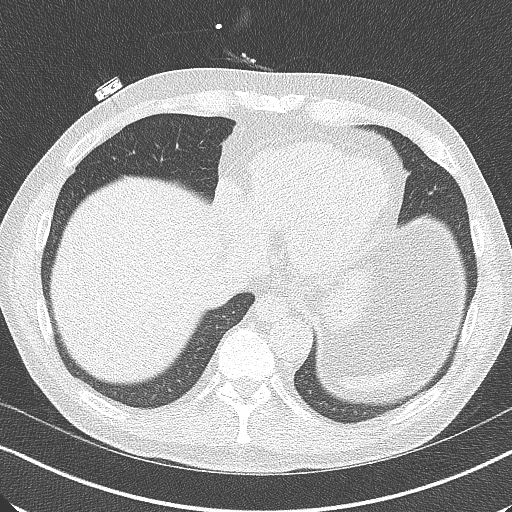
[im 15/43  lung]
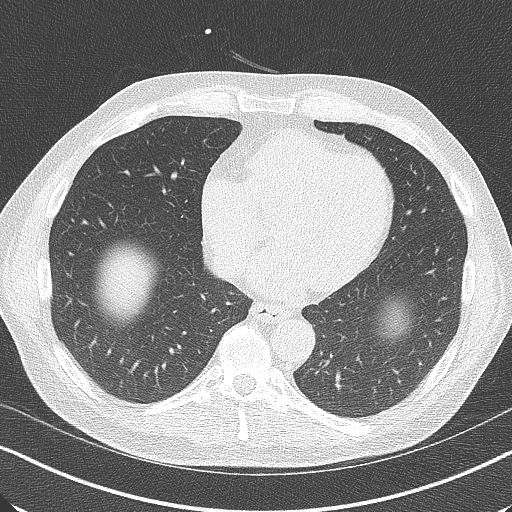
[im 22/43  lung]
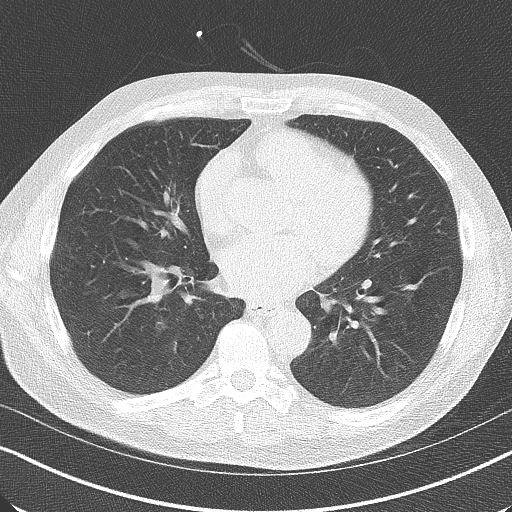
[im 29/43  lung]
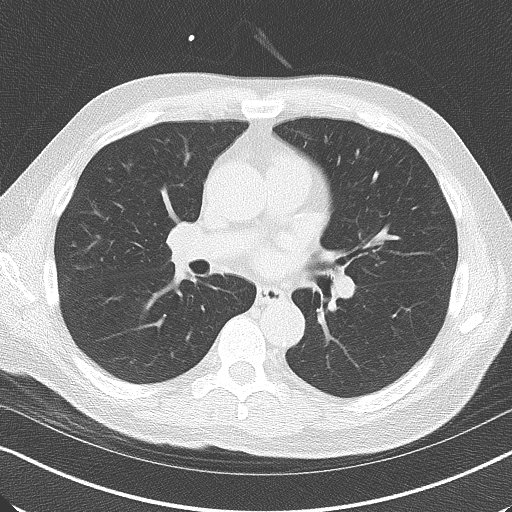
[im 36/43  lung]
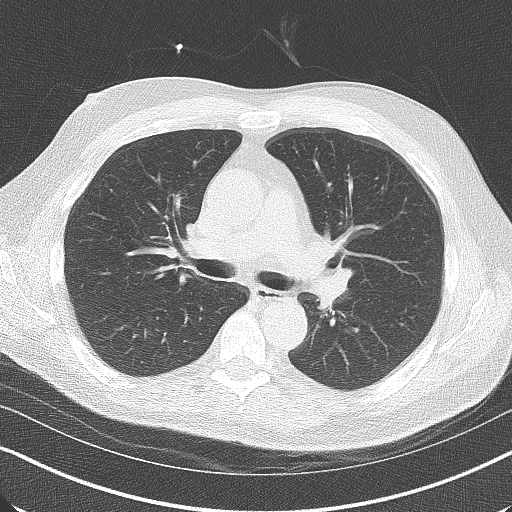

[Series 4: lung st 70 % · axial · 0.68mm/px · z∈[-258,-174]mm · 5 of 43 slices shown]
[im 8/43  lung]
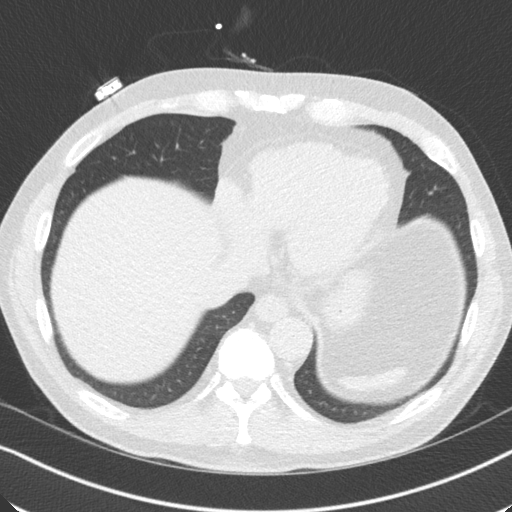
[im 15/43  lung]
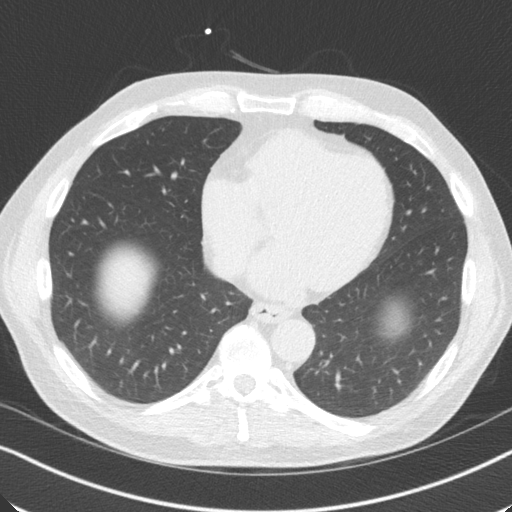
[im 22/43  lung]
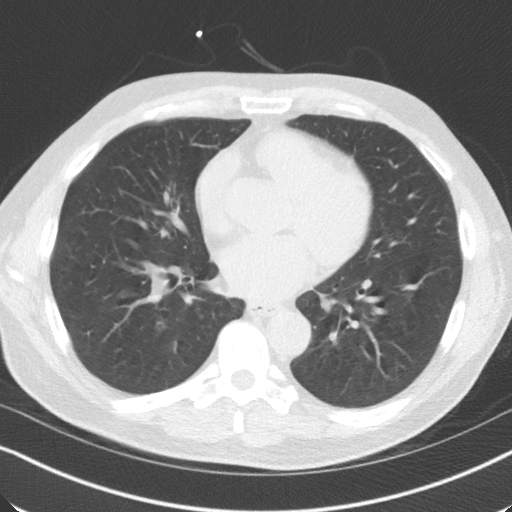
[im 29/43  lung]
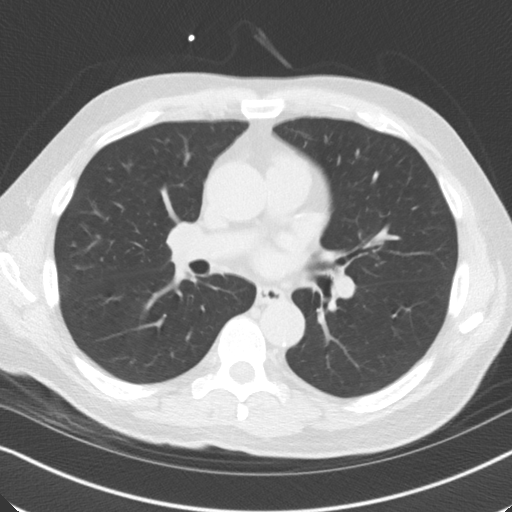
[im 36/43  lung]
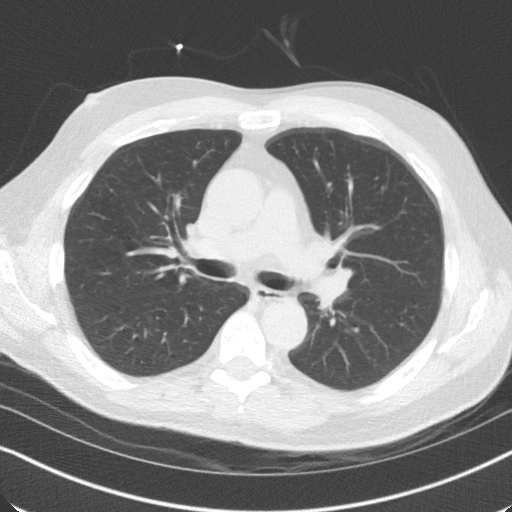

[14 of 20 positions shown; findings below may reference images not displayed]

FINDINGS: Vascular: Aortic atherosclerosis.

Mediastinum/Nodes: No imaged thoracic adenopathy.

Lungs/Pleura: No pleural fluid. Mild motion degradation. Clear
imaged lungs.

Upper Abdomen: Normal imaged portions of the liver, spleen, stomach.

Musculoskeletal: No acute osseous abnormality.
IMPRESSION: 1.  No acute findings in the imaged extracardiac chest.
2. Aortic Atherosclerosis (X069D-7Q2.2).
3. Mild motion degradation.
FINDINGS: Non-cardiac: See separate report from [REDACTED].

Ascending Aorta: Mildly dilated.  3.78 cm.

Pericardium: Normal

Coronary arteries:

Normal anatomy.  Calcification noted in the LAD.
IMPRESSION: Coronary calcium score of 74.7. This was 45th percentile for age and
sex matched control.

*** End of Addendum ***
EXAM:
OVER-READ INTERPRETATION  CT CHEST

The following report is an over-read performed by radiologist Dr.
Evangelista Reynaldo Bouet [REDACTED] on 05/29/2019. This over-read
does not include interpretation of cardiac or coronary anatomy or
pathology. The calcium score interpretation by the cardiologist is
attached.
FINDINGS: Vascular: Aortic atherosclerosis.

Mediastinum/Nodes: No imaged thoracic adenopathy.

Lungs/Pleura: No pleural fluid. Mild motion degradation. Clear
imaged lungs.

Upper Abdomen: Normal imaged portions of the liver, spleen, stomach.

Musculoskeletal: No acute osseous abnormality.
IMPRESSION: 1.  No acute findings in the imaged extracardiac chest.
2. Aortic Atherosclerosis (X069D-7Q2.2).
3. Mild motion degradation.

## 2021-10-04 DIAGNOSIS — Z23 Encounter for immunization: Secondary | ICD-10-CM | POA: Diagnosis not present

## 2022-01-01 ENCOUNTER — Encounter: Payer: Self-pay | Admitting: Internal Medicine

## 2022-02-22 ENCOUNTER — Encounter: Payer: Self-pay | Admitting: Internal Medicine

## 2022-02-22 NOTE — Patient Instructions (Addendum)
      Blood work was ordered.   The lab is on the first floor.    Medications changes include :   increase propranolol to 40 mg twice a day    A referral was ordered for Cardiothoracic Surgery.     Someone will call you to schedule an appointment.    Return in about 1 year (around 02/24/2023) for follow up.

## 2022-02-22 NOTE — Progress Notes (Signed)
Subjective:    Patient ID: Ricky Tate, male    DOB: 1950/08/25, 72 y.o.   MRN: [id number]     HPI Danny is here for follow up of his chronic medical problems, including htn, asc aorta dilation, prediabetes, mild CAD, hld  BP higher at home - DBP on the higher side.  He was worried if he needed more medication.  Walking 30 minutes a day, yoga 30 minutes a day  He has retired and is doing well.  Medications and allergies reviewed with patient and updated if appropriate.  Current Outpatient Medications on File Prior to Visit  Medication Sig Dispense Refill   ASTRAGALUS PO Take 1 tablet by mouth daily at 6 (six) AM.     LYSINE PO Take 1 tablet by mouth daily at 6 (six) AM.     Multiple Vitamin (MULTIVITAMIN PO) Take 1 tablet by mouth daily at 6 (six) AM.     propranolol (INDERAL) 20 MG tablet TAKE 1 TABLET(20 MG) BY MOUTH TWICE DAILY. FOLLOW UP APPOINTMENT AS NEEDED FOR ADDITIONAL REFILLS 60 tablet 0   Psyllium (WAL-MUCIL PO) Take 1 tablet by mouth in the morning, at noon, in the evening, and at bedtime.     No current facility-administered medications on file prior to visit.     Review of Systems  Constitutional:  Negative for chills and fever.  HENT:  Positive for rhinorrhea.   Eyes:  Negative for visual disturbance.  Respiratory:  Negative for cough, shortness of breath and wheezing.   Cardiovascular:  Negative for chest pain, palpitations and leg swelling.  Gastrointestinal:  Negative for abdominal pain, blood in stool, constipation and diarrhea.       No gerd  Genitourinary:  Negative for difficulty urinating, dysuria and hematuria.  Musculoskeletal:  Positive for back pain (low back pain). Negative for arthralgias.       Stiffness  Skin:  Negative for rash.  Neurological:  Negative for dizziness, light-headedness, numbness and headaches.  Psychiatric/Behavioral:  Negative for dysphoric mood. The patient is not nervous/anxious.        Objective:    Vitals:   02/23/22 1025  BP: 120/78  Pulse: 65  Temp: 98.1 F (36.7 C)  SpO2: 98%   BP Readings from Last 3 Encounters:  02/23/22 120/78  02/22/21 (!) 142/88  04/08/20 111/77   Wt Readings from Last 3 Encounters:  02/23/22 185 lb (83.9 kg)  02/22/21 185 lb 12.8 oz (84.3 kg)  04/08/20 186 lb (84.4 kg)   Body mass index is 27.32 kg/m.    Physical Exam Constitutional:      General: He is not in acute distress.    Appearance: Normal appearance. He is not ill-appearing.  HENT:     Head: Normocephalic and atraumatic.  Eyes:     Conjunctiva/sclera: Conjunctivae normal.  Cardiovascular:     Rate and Rhythm: Normal rate and regular rhythm.     Heart sounds: Normal heart sounds. No murmur heard. Pulmonary:     Effort: Pulmonary effort is normal. No respiratory distress.     Breath sounds: Normal breath sounds. No wheezing or rales.  Abdominal:     General: There is no distension.     Palpations: Abdomen is soft.     Tenderness: There is no abdominal tenderness. There is no guarding or rebound.  Musculoskeletal:     Right lower leg: No edema.     Left lower leg: No edema.  Skin:    General:  Skin is warm and dry.     Findings: No rash.  Neurological:     Mental Status: He is alert. Mental status is at baseline.  Psychiatric:        Mood and Affect: Mood normal.        Lab Results  Component Value Date   WBC 4.6 02/22/2021   HGB 15.1 02/22/2021   HCT 44.0 02/22/2021   PLT 232.0 02/22/2021   GLUCOSE 92 02/22/2021   CHOL 200 02/22/2021   TRIG 138.0 02/22/2021   HDL 62.90 02/22/2021   LDLDIRECT 165.8 01/31/2010   LDLCALC 110 (H) 02/22/2021   ALT 22 02/22/2021   AST 30 02/22/2021   NA 138 02/22/2021   K 4.2 02/22/2021   CL 100 02/22/2021   CREATININE 0.73 02/22/2021   BUN 11 02/22/2021   CO2 30 02/22/2021   TSH 2.93 02/18/2018   PSA 1.28 02/22/2021   HGBA1C 5.6 02/22/2021     Assessment & Plan:    See Problem List for Assessment and Plan of  chronic medical problems.

## 2022-02-23 ENCOUNTER — Ambulatory Visit (INDEPENDENT_AMBULATORY_CARE_PROVIDER_SITE_OTHER): Payer: Medicare Other | Admitting: Internal Medicine

## 2022-02-23 VITALS — BP 120/78 | HR 65 | Temp 98.1°F | Ht 69.0 in | Wt 185.0 lb

## 2022-02-23 DIAGNOSIS — I10 Essential (primary) hypertension: Secondary | ICD-10-CM | POA: Diagnosis not present

## 2022-02-23 DIAGNOSIS — R931 Abnormal findings on diagnostic imaging of heart and coronary circulation: Secondary | ICD-10-CM | POA: Diagnosis not present

## 2022-02-23 DIAGNOSIS — E782 Mixed hyperlipidemia: Secondary | ICD-10-CM

## 2022-02-23 DIAGNOSIS — G252 Other specified forms of tremor: Secondary | ICD-10-CM

## 2022-02-23 DIAGNOSIS — Z125 Encounter for screening for malignant neoplasm of prostate: Secondary | ICD-10-CM | POA: Diagnosis not present

## 2022-02-23 DIAGNOSIS — I77810 Thoracic aortic ectasia: Secondary | ICD-10-CM | POA: Diagnosis not present

## 2022-02-23 DIAGNOSIS — R7303 Prediabetes: Secondary | ICD-10-CM | POA: Diagnosis not present

## 2022-02-23 LAB — COMPREHENSIVE METABOLIC PANEL
ALT: 15 U/L (ref 0–53)
AST: 17 U/L (ref 0–37)
Albumin: 4.6 g/dL (ref 3.5–5.2)
Alkaline Phosphatase: 43 U/L (ref 39–117)
BUN: 9 mg/dL (ref 6–23)
CO2: 29 mEq/L (ref 19–32)
Calcium: 9.8 mg/dL (ref 8.4–10.5)
Chloride: 102 mEq/L (ref 96–112)
Creatinine, Ser: 0.77 mg/dL (ref 0.40–1.50)
GFR: 90.73 mL/min (ref >60.00)
Glucose, Bld: 81 mg/dL (ref 70–99)
Potassium: 4 mEq/L (ref 3.5–5.1)
Sodium: 139 mEq/L (ref 135–145)
Total Bilirubin: 0.7 mg/dL (ref 0.2–1.2)
Total Protein: 7.3 g/dL (ref 6.0–8.3)

## 2022-02-23 LAB — CBC WITH DIFFERENTIAL/PLATELET
Basophils Absolute: 0 10*3/uL (ref 0.0–0.1)
Basophils Relative: 0.9 % (ref 0.0–3.0)
Eosinophils Absolute: 0.2 10*3/uL (ref 0.0–0.7)
Eosinophils Relative: 4.7 % (ref 0.0–5.0)
HCT: 45.7 % (ref 39.0–52.0)
Hemoglobin: 15.6 g/dL (ref 13.0–17.0)
Lymphocytes Relative: 25.2 % (ref 12.0–46.0)
Lymphs Abs: 1.3 10*3/uL (ref 0.7–4.0)
MCHC: 34.1 g/dL (ref 30.0–36.0)
MCV: 93.5 fl (ref 78.0–100.0)
Monocytes Absolute: 0.5 10*3/uL (ref 0.1–1.0)
Monocytes Relative: 10.9 % (ref 3.0–12.0)
Neutro Abs: 2.9 10*3/uL (ref 1.4–7.7)
Neutrophils Relative %: 58.3 % (ref 43.0–77.0)
Platelets: 212 10*3/uL (ref 150.0–400.0)
RBC: 4.89 Mil/uL (ref 4.22–5.81)
RDW: 12.6 % (ref 11.5–15.5)
WBC: 5 10*3/uL (ref 4.0–10.5)

## 2022-02-23 LAB — LIPID PANEL
Cholesterol: 206 mg/dL — ABNORMAL HIGH (ref 0–200)
HDL: 45.6 mg/dL (ref >39.00)
LDL Cholesterol: 135 mg/dL — ABNORMAL HIGH (ref 0–99)
NonHDL: 160.38
Total CHOL/HDL Ratio: 5
Triglycerides: 129 mg/dL (ref 0.0–149.0)
VLDL: 25.8 mg/dL (ref 0.0–40.0)

## 2022-02-23 LAB — PSA, MEDICARE: PSA: 0.85 ng/ml (ref 0.10–4.00)

## 2022-02-23 LAB — HEMOGLOBIN A1C: Hgb A1c MFr Bld: 5.5 % (ref 4.6–6.5)

## 2022-02-23 MED ORDER — PROPRANOLOL HCL 40 MG PO TABS: 40.0000 mg | ORAL_TABLET | Freq: Two times a day (BID) | ORAL | 3 refills | Status: DC

## 2022-02-23 NOTE — Assessment & Plan Note (Signed)
Chronic Very mild CAD Discussed possibly starting a statin for prevention for his very mild CAD and aortic ascending aneurysm versus monitoring-repeating CT coronary calcium score in a couple more years Will also discuss with cardiothoracic surgery if being on the statin would be advantageous for the ascending aortic dilation

## 2022-02-23 NOTE — Assessment & Plan Note (Addendum)
Chronic Coronary calcium score 74.7 in 2021 Mild asc aortic aneurysm Regular exercise and healthy diet encouraged Check lipid panel  Continue lifestyle control Discussed briefly about starting a statin-he is not sure if he wants to do that Will see what his blood work is and see what [facility] thinks about statin as far as the ascending aorta dilation

## 2022-02-23 NOTE — Assessment & Plan Note (Signed)
Chronic Fairly controlled, but has gotten slightly worse Will be increase propranolol to 40 mg twice daily for better control of his blood pressure which will also help his intention tremor

## 2022-02-23 NOTE — Assessment & Plan Note (Addendum)
Chronic Blood pressure well controlled here today, but is slightly high at home at times CMP Increase propranolol to 40 mg twice daily-he will continue to monitor blood pressure at home and make sure does not go too low-also on this for his intention tremor

## 2022-02-23 NOTE — Assessment & Plan Note (Signed)
PSA ordered

## 2022-02-23 NOTE — Assessment & Plan Note (Signed)
Chronic First identified 2021-slight growth Will refer to [facility] for them to follow ?  Consider statin

## 2022-02-23 NOTE — Assessment & Plan Note (Signed)
Chronic Check a1c Low sugar / carb diet Stressed regular exercise  

## 2022-02-27 ENCOUNTER — Telehealth: Payer: Self-pay | Admitting: Internal Medicine

## 2022-02-27 DIAGNOSIS — I77810 Thoracic aortic ectasia: Secondary | ICD-10-CM

## 2022-02-27 NOTE — Telephone Encounter (Signed)
Please call him - let him know I have ordered a Ct scan to evaluate his aorta - once this is done he will see the cardiothoracic group and they will be the ones to monitor annually after this.

## 2022-02-28 NOTE — Telephone Encounter (Signed)
Spoke with patient today. 

## 2022-03-05 ENCOUNTER — Ambulatory Visit
Admission: RE | Admit: 2022-03-05 | Discharge: 2022-03-05 | Disposition: A | Discharge status: Home / Self Care | Payer: Medicare Other | Source: Ambulatory Visit | Attending: Internal Medicine | Admitting: Internal Medicine

## 2022-03-05 DIAGNOSIS — I77810 Thoracic aortic ectasia: Secondary | ICD-10-CM

## 2022-03-05 DIAGNOSIS — I712 Thoracic aortic aneurysm, without rupture, unspecified: Secondary | ICD-10-CM | POA: Diagnosis not present

## 2022-03-05 DIAGNOSIS — I2510 Atherosclerotic heart disease of native coronary artery without angina pectoris: Secondary | ICD-10-CM | POA: Diagnosis not present

## 2022-03-05 DIAGNOSIS — I359 Nonrheumatic aortic valve disorder, unspecified: Secondary | ICD-10-CM | POA: Diagnosis not present

## 2022-03-05 DIAGNOSIS — I700 Atherosclerosis of aorta: Secondary | ICD-10-CM | POA: Diagnosis not present

## 2022-03-05 MED ORDER — IOPAMIDOL (ISOVUE-370) INJECTION 76%
75.0000 mL | Freq: Once | INTRAVENOUS | Status: AC | PRN
  Administered 2022-03-05: 75 mL via INTRAVENOUS

## 2022-03-08 NOTE — Progress Notes (Addendum)
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 Recent CTA done 03/05/2022 showed the ascending thoracic aortic aneurysm to be 4.2 cm. He was referred to [facility] for further surveillance of the ATAA. He denies chest pain, pressure, tightness, shortness of breath, or LE edema. He fast walks almost daily without any problem.  Past Medical History:  Diagnosis Date   Benign prostatic hypertrophy    Cancer ([place])    skin,squamous cell..pmh of ([facility])   Cataract    bialteral-not a surgical candidate at this time (03/24/2020)   Gilbert's syndrome    Hyperlipidemia    NMR:LDL [id number]) HDL 77,TG 61 - diet controlled   Hyperplastic colon polyp    pmh   Hypertension    on meds   Syncope    vaso vagal   Has cataracts and will likely need surgery at some point  Past Surgical History:  Procedure Laterality Date   COLONOSCOPY  05/2011    Dr Olevia Perches   COLONOSCOPY  2018   [facility]-MAC-good prep-TICS/hems/HPP x4/multiple frags fo TA/tubulovillous adenomas   COLONOSCOPY W/ POLYPECTOMY  2008   VASECTOMY      Family History  Problem Relation Age of Onset   Alzheimer's disease Mother    Colon cancer Mother 43   Lung cancer Father        smoker   Alzheimer's disease Maternal Aunt        x2 aunts.x 2   Alzheimer's disease Maternal Uncle        x 2   Heart disease Paternal Uncle        CABG,CAD   Diabetes Paternal Uncle    Hypertension Maternal Grandmother    Hypertension Maternal Grandfather    Stroke Maternal Grandfather 68   Hypertension Paternal  Grandmother    Stroke Paternal Grandmother        in 21s   Hypertension Paternal Grandfather    Stroke Paternal Grandfather        late 74s   Schizophrenia Other        aunt   Diabetes Paternal Aunt    Stomach cancer Neg Hx    Heart attack Neg Hx    Colon polyps Neg Hx    Rectal cancer Neg Hx     Social History Social History   Tobacco Use   Smoking status: Never   Smokeless tobacco: Never  Vaping Use   Vaping Use: Never used  Substance Use Topics   Alcohol use: Not Currently   Drug use: No  He is married and has 2 children. He is retired  Current Outpatient Medications  Medication Sig Dispense Refill   ASTRAGALUS PO Take 1 tablet by mouth daily at 6 (six) AM.     LYSINE PO Take 1 tablet by mouth daily at 6 (six) AM.     Multiple Vitamin (MULTIVITAMIN PO) Take 1 tablet by mouth daily at 6 (six) AM.     propranolol (INDERAL) 40 MG tablet Take 1  tablet (40 mg total) by mouth 2 (two) times daily. 180 tablet 3   Psyllium (WAL-MUCIL PO) Take 1 tablet by mouth in the morning, at noon, in the evening, and at bedtime.    Allergies: No Known Allergies  Review of Systems: Chest Pain [ N ] Resting SOB Aqua.Slicker ] Exertional SOB [  N]  Pedal Edema [ N ] Syncope [ N ]  General Review of Systems: [Y] = yes [ N]=no  Consitutional:   nausea [ N];  fever [ N];  Eye :  Amaurosis fugax[  N];  Resp: cough [ N];  hemoptysis[ ]$ ;  GI: vomiting[ N]; melena[N ]; hematochezia [N];  [id number] ]; Heme/Lymph: anemia[ N];  Neuro: TIA[ N];stroke[N ];  seizures[ N];  Endocrine: diabetes[N ];   Vital Signs: Vitals:   03/14/22 1401  BP: (!) 190/100  Pulse: 86  Resp: 20  SpO2: 97%     Physical Exam: CV-RRR, no murmur Neck-No carotid bruit Pulmonary-Clear to auscultation bilaterally Abdomen-Soft, non tender, bowel sounds present Extremities-no LE edema Neurologic-Grossly intact without focal deficit. Has intentional tremor  Diagnostic Tests:  Narrative & Impression  CLINICAL DATA:   Aortic aneurysm.   EXAM: CT ANGIOGRAPHY CHEST WITH CONTRAST   TECHNIQUE: Multidetector CT imaging of the chest was performed using the standard protocol during bolus administration of intravenous contrast. Multiplanar CT image reconstructions and MIPs were obtained to evaluate the vascular anatomy.   RADIATION DOSE REDUCTION: This exam was performed according to the departmental dose-optimization program which includes automated exposure control, adjustment of the mA and/or kV according to patient size and/or use of iterative reconstruction technique.   CONTRAST:  73m ISOVUE-370 IOPAMIDOL (ISOVUE-370) INJECTION 76%   COMPARISON:  CT cardiac coronary 05/29/2019   FINDINGS: Cardiovascular: Ascending aorta measures 3.7 by 4.0 cm and appears unchanged. There is no evidence for aortic dissection. Origin of the great vessels is within normal limits. There is no central pulmonary embolism. The heart is borderline enlarged, unchanged. Mild calcified atherosclerotic disease noted in the aorta.   Mediastinum/Nodes: No enlarged mediastinal, hilar, or axillary lymph nodes. Thyroid gland, trachea, and esophagus demonstrate no significant findings.   Lungs/Pleura: Lungs are clear. No pleural effusion or pneumothorax.   Upper Abdomen: No acute abnormality.   Musculoskeletal: No chest wall abnormality. No acute or significant osseous findings.   Review of the MIP images confirms the above findings.   IMPRESSION: 1. Stable aneurysmal dilatation of the ascending thoracic aorta measuring 4.0 cm. Recommend annual imaging followup by CTA or MRA. This recommendation follows 2010 ACCF/AHA/AATS/ACR/ASA/SCA/SCAI/SIR/STS/SVM Guidelines for the Diagnosis and Management of Patients with Thoracic Aortic Disease. Circulation. 2010; 121:[id number] Aortic aneurysm NOS (ICD10-I71.9) 2. No acute cardiopulmonary process.     Electronically Signed   By: ARonney AstersM.D.   On: 03/05/2022 16:42     Impression and Plan: Patient is hypertensive with 2 readings (184/104 second reading). He takes his blood pressure daily and states it is usually 140 or less. From EMR, he had not had this high blood pressure ever. He is somewhat anxious and there were traffic issues/accident on his way here. Patient will take blood pressure later today. He knows if remains elevated as was in the office to be seen asap (want to avoid potential for stroke). We discussed the results of the CTA done on 03/05/2022. CTA of the chest  with a 4 cm ascending aortic aneurysm.  Echocardiogram shows a tricuspid aortic valve with trivial aortic regurgitation.  We discussed the natural history  and and risk factors for growth of ascending aortic aneurysms.  We covered the importance of smoking cessation, tight blood pressure control, refraining from lifting heavy objects, and avoiding fluoroquinolones.  The patient is aware of signs and symptoms of aortic dissection and when to present to the emergency department.  We will continue surveillance and a repeat CTA was ordered for 1 year.   Nani Skillern, PA-C [facility] [phone]

## 2022-03-14 ENCOUNTER — Institutional Professional Consult (permissible substitution) (INDEPENDENT_AMBULATORY_CARE_PROVIDER_SITE_OTHER): Payer: Medicare Other | Admitting: Physician Assistant

## 2022-03-14 VITALS — BP 190/100 | HR 86 | Resp 20 | Ht 69.0 in | Wt 185.0 lb

## 2022-03-14 DIAGNOSIS — I77810 Thoracic aortic ectasia: Secondary | ICD-10-CM | POA: Diagnosis not present

## 2022-03-14 NOTE — Patient Instructions (Signed)
Risk Modification in those with ascending thoracic aortic aneurysm:  Continue good control of blood pressure (prefer SBP 130/80 or less)-continue Propanolol  2. Avoid fluoroquinolone antibiotics (I.e Ciprofloxacin, Avelox, Levofloxacin, Ofloxacin)  3.  Use of statin (to decrease cardiovascular risk)-encourage exercise and heart healthy, low Sodium diet. Per lipid profile 02/2022: total cholesteral 206, Triglyceride 129, HDL 46, and LDL 135. Will defer to primary when/if to initiate statin  4.  Exercise and activity limitations is individualized, but in general, contact sports are to be  avoided and one should avoid heavy lifting (defined as half of ideal body weight) and exercises involving sustained Valsalva maneuver.  5. Counseling for those suspected of having genetically mediated disease. First-degree relatives of those with TAA disease should be screened as well as those who have a connective tissue disease (I.e with Marfan syndrome, Ehlers-Danlos syndrome,  and Loeys-Dietz syndrome) or a  bicuspid aortic valve,have an increased risk for  complications related to TAA. No family history of above. Per echo 02/2022: aortic vlave is tricuspid, no aortic stenosis, trivial aortic insufficiency, mild dilatation of aortic root and ascending aorta at 42 mm    6. He has no history of tobacco abuse

## 2022-03-19 IMAGING — DX DG CHEST 2V
2 series · 2 of 2 positions shown · non-contrast
Comparison: None.

CLINICAL DATA: Persistent cough.

EXAM:
CHEST - 2 VIEW

[chest pa]
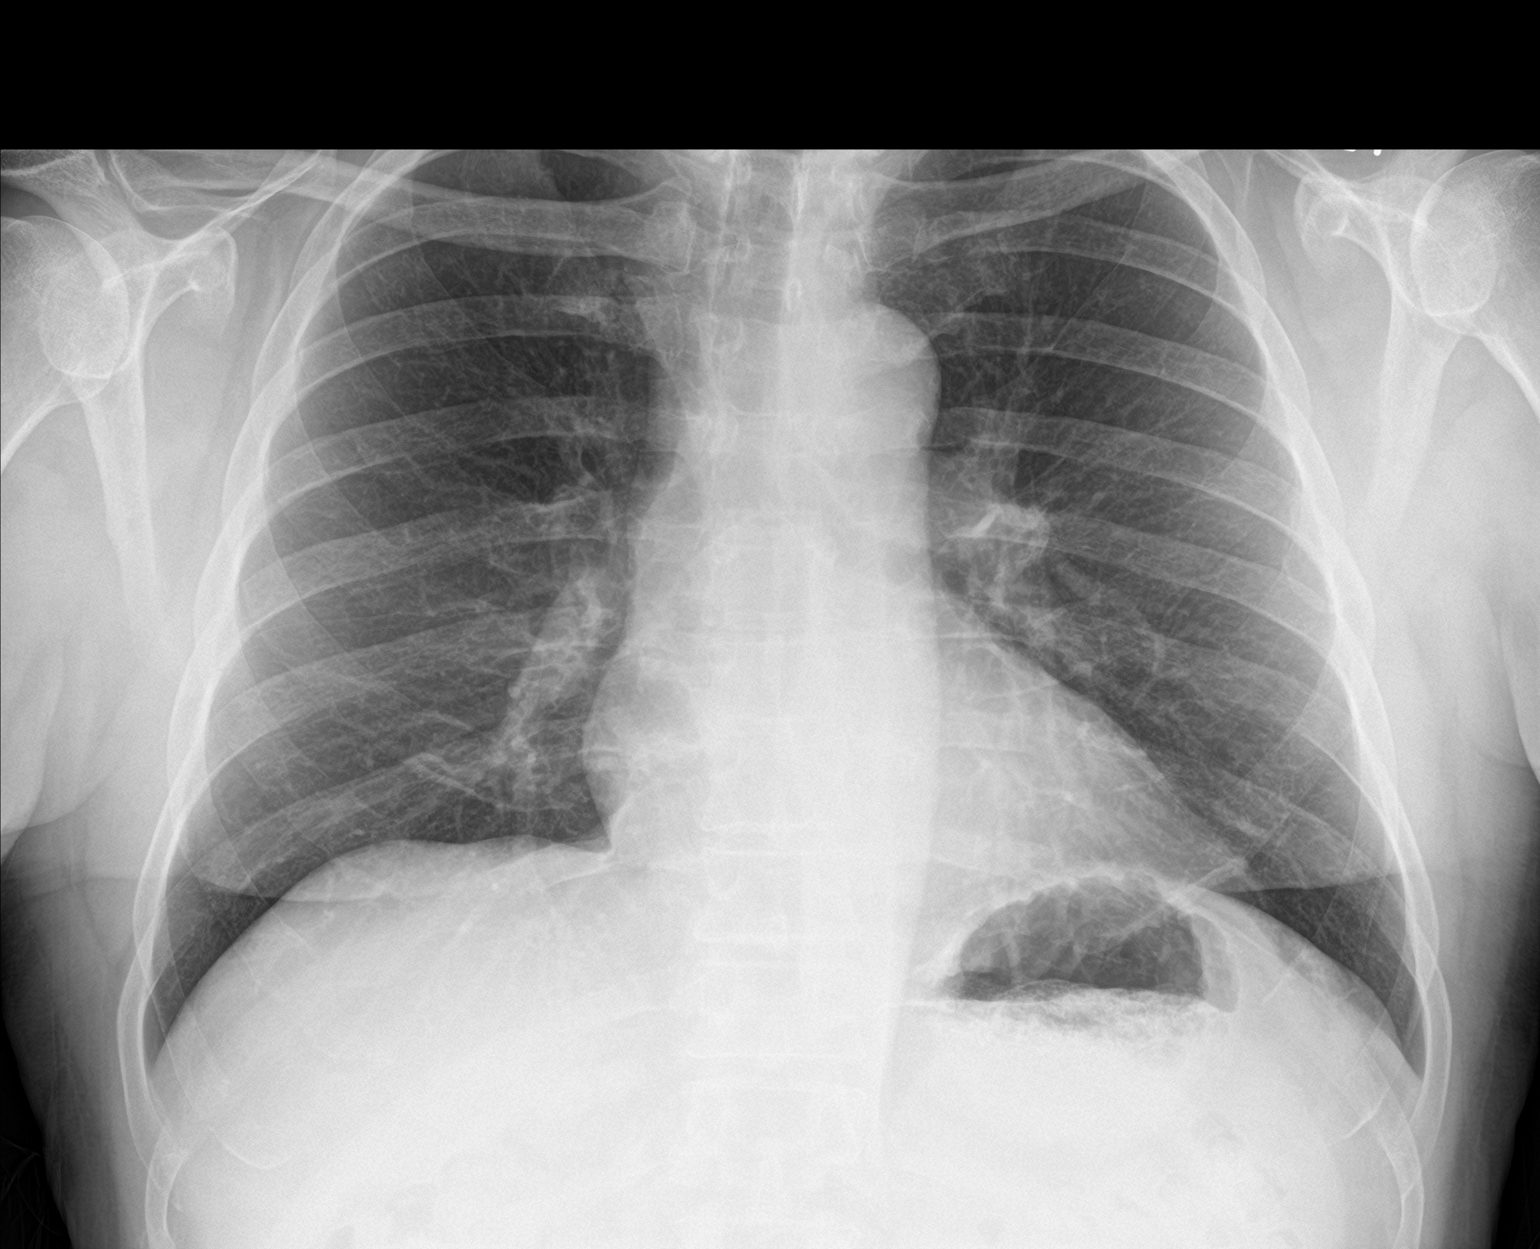

[chest lat]
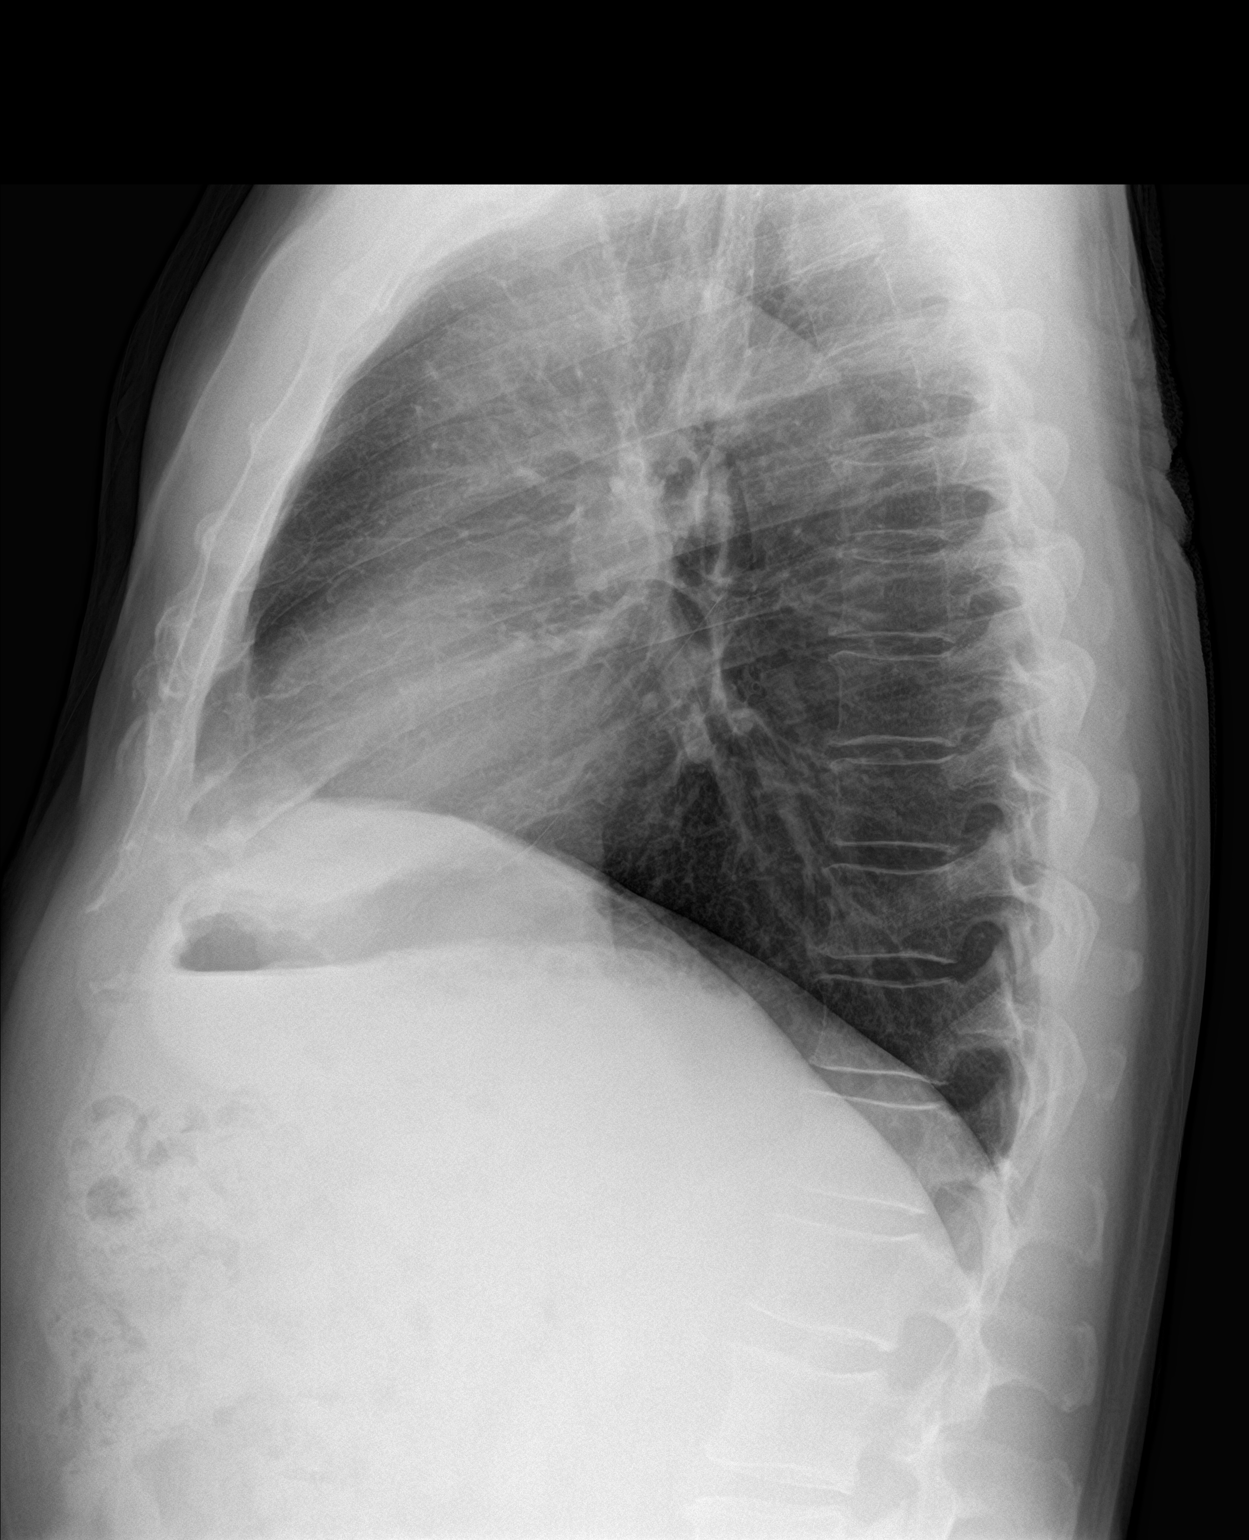

[2 of 2 positions shown; findings below may reference images not displayed]

FINDINGS: Trachea is midline. Heart size normal. Lungs are clear. No pleural
fluid.
IMPRESSION: Negative.

## 2022-04-03 ENCOUNTER — Ambulatory Visit (INDEPENDENT_AMBULATORY_CARE_PROVIDER_SITE_OTHER): Payer: Medicare Other

## 2022-04-03 VITALS — Ht 69.0 in | Wt 185.0 lb

## 2022-04-03 DIAGNOSIS — Z0000 Encounter for general adult medical examination without abnormal findings: Secondary | ICD-10-CM

## 2022-04-03 NOTE — Patient Instructions (Signed)
[name] , Thank you for taking time to come for your Medicare Wellness Visit. I appreciate your ongoing commitment to your health goals. Please review the following plan we discussed and let me know if I can assist you in the future.   These are the goals we discussed:  Goals      Remain active and independent        This is a list of the screening recommended for you and due dates:  Health Maintenance  Topic Date Due   COVID-19 Vaccine (4 - 2023-24 season) 09/22/2021   Medicare Annual Wellness Visit  04/03/2023   Colon Cancer Screening  04/08/2025   DTaP/Tdap/Td vaccine (3 - Td or Tdap) 06/07/2031   Pneumonia Vaccine  Completed   Flu Shot  Completed   Hepatitis C Screening: USPSTF Recommendation to screen - Ages 18-79 yo.  Completed   Zoster (Shingles) Vaccine  Completed   HPV Vaccine  Aged Out    Advanced directives: Please bring a copy of your health care power of attorney and living will to the office to be added to your chart at your convenience.   Conditions/risks identified: Aim for 30 minutes of exercise or brisk walking, 6-8 glasses of water, and 5 servings of fruits and vegetables each day.   Next appointment: Follow up in one year for your annual wellness visit.   Preventive Care 7 Years and Older, Male  Preventive care refers to lifestyle choices and visits with your health care provider that can promote health and wellness. What does preventive care include? A yearly physical exam. This is also called an annual well check. Dental exams once or twice a year. Routine eye exams. Ask your health care provider how often you should have your eyes checked. Personal lifestyle choices, including: Daily care of your teeth and gums. Regular physical activity. Eating a healthy diet. Avoiding tobacco and drug use. Limiting alcohol use. Practicing safe sex. Taking low doses of aspirin every day. Taking vitamin and mineral supplements as recommended by your health care  provider. What happens during an annual well check? The services and screenings done by your health care provider during your annual well check will depend on your age, overall health, lifestyle risk factors, and family history of disease. Counseling  Your health care provider may ask you questions about your: Alcohol use. Tobacco use. Drug use. Emotional well-being. Home and relationship well-being. Sexual activity. Eating habits. History of falls. Memory and ability to understand (cognition). Work and work Statistician. Screening  You may have the following tests or measurements: Height, weight, and BMI. Blood pressure. Lipid and cholesterol levels. These may be checked every 5 years, or more frequently if you are over 57 years old. Skin check. Lung cancer screening. You may have this screening every year starting at age 65 if you have a 30-pack-year history of smoking and currently smoke or have quit within the past 15 years. Fecal occult blood test (FOBT) of the stool. You may have this test every year starting at age 75. Flexible sigmoidoscopy or colonoscopy. You may have a sigmoidoscopy every 5 years or a colonoscopy every 10 years starting at age 42. Prostate cancer screening. Recommendations will vary depending on your family history and other risks. Hepatitis C blood test. Hepatitis B blood test. Sexually transmitted disease (STD) testing. Diabetes screening. This is done by checking your blood sugar (glucose) after you have not eaten for a while (fasting). You may have this done every 1-3 years. Abdominal  aortic aneurysm (AAA) screening. You may need this if you are a current or former smoker. Osteoporosis. You may be screened starting at age 26 if you are at high risk. Talk with your health care provider about your test results, treatment options, and if necessary, the need for more tests. Vaccines  Your health care provider may recommend certain vaccines, such  as: Influenza vaccine. This is recommended every year. Tetanus, diphtheria, and acellular pertussis (Tdap, Td) vaccine. You may need a Td booster every 10 years. Zoster vaccine. You may need this after age 73. Pneumococcal 13-valent conjugate (PCV13) vaccine. One dose is recommended after age 74. Pneumococcal polysaccharide (PPSV23) vaccine. One dose is recommended after age 17. Talk to your health care provider about which screenings and vaccines you need and how often you need them. This information is not intended to replace advice given to you by your health care provider. Make sure you discuss any questions you have with your health care provider. Document Released: 02/04/2015 Document Revised: 09/28/2015 Document Reviewed: 11/09/2014 Elsevier Interactive Patient Education  2017 [employer] Prevention in the Home Falls can cause injuries. They can happen to people of all ages. There are many things you can do to make your home safe and to help prevent falls. What can I do on the outside of my home? Regularly fix the edges of walkways and driveways and fix any cracks. Remove anything that might make you trip as you walk through a door, such as a raised step or threshold. Trim any bushes or trees on the path to your home. Use bright outdoor lighting. Clear any walking paths of anything that might make someone trip, such as rocks or tools. Regularly check to see if handrails are loose or broken. Make sure that both sides of any steps have handrails. Any raised decks and porches should have guardrails on the edges. Have any leaves, snow, or ice cleared regularly. Use sand or salt on walking paths during winter. Clean up any spills in your garage right away. This includes oil or grease spills. What can I do in the bathroom? Use night lights. Install grab bars by the toilet and in the tub and shower. Do not use towel bars as grab bars. Use non-skid mats or decals in the tub or  shower. If you need to sit down in the shower, use a plastic, non-slip stool. Keep the floor dry. Clean up any water that spills on the floor as soon as it happens. Remove soap buildup in the tub or shower regularly. Attach bath mats securely with double-sided non-slip rug tape. Do not have throw rugs and other things on the floor that can make you trip. What can I do in the bedroom? Use night lights. Make sure that you have a light by your bed that is easy to reach. Do not use any sheets or blankets that are too big for your bed. They should not hang down onto the floor. Have a firm chair that has side arms. You can use this for support while you get dressed. Do not have throw rugs and other things on the floor that can make you trip. What can I do in the kitchen? Clean up any spills right away. Avoid walking on wet floors. Keep items that you use a lot in easy-to-reach places. If you need to reach something above you, use a strong step stool that has a grab bar. Keep electrical cords out of the way. Do not use  floor polish or wax that makes floors slippery. If you must use wax, use non-skid floor wax. Do not have throw rugs and other things on the floor that can make you trip. What can I do with my stairs? Do not leave any items on the stairs. Make sure that there are handrails on both sides of the stairs and use them. Fix handrails that are broken or loose. Make sure that handrails are as long as the stairways. Check any carpeting to make sure that it is firmly attached to the stairs. Fix any carpet that is loose or worn. Avoid having throw rugs at the top or bottom of the stairs. If you do have throw rugs, attach them to the floor with carpet tape. Make sure that you have a light switch at the top of the stairs and the bottom of the stairs. If you do not have them, ask someone to add them for you. What else can I do to help prevent falls? Wear shoes that: Do not have high heels. Have  rubber bottoms. Are comfortable and fit you well. Are closed at the toe. Do not wear sandals. If you use a stepladder: Make sure that it is fully opened. Do not climb a closed stepladder. Make sure that both sides of the stepladder are locked into place. Ask someone to hold it for you, if possible. Clearly mark and make sure that you can see: Any grab bars or handrails. First and last steps. Where the edge of each step is. Use tools that help you move around (mobility aids) if they are needed. These include: Canes. Walkers. Scooters. Crutches. Turn on the lights when you go into a dark area. Replace any light bulbs as soon as they burn out. Set up your furniture so you have a clear path. Avoid moving your furniture around. If any of your floors are uneven, fix them. If there are any pets around you, be aware of where they are. Review your medicines with your doctor. Some medicines can make you feel dizzy. This can increase your chance of falling. Ask your doctor what other things that you can do to help prevent falls. This information is not intended to replace advice given to you by your health care provider. Make sure you discuss any questions you have with your health care provider. Document Released: 11/04/2008 Document Revised: 06/16/2015 Document Reviewed: 02/12/2014 Elsevier Interactive Patient Education  2017 [employer].

## 2022-04-03 NOTE — Progress Notes (Signed)
Subjective:   Ricky Tate is a 72 y.o. male who presents for Medicare Annual/Subsequent preventive examination.  I connected with  Ricky Tate on 04/03/22 by a audio enabled telemedicine application and verified that I am speaking with the correct person using two identifiers.  Patient Location: Home  Provider Location: Home Office  I discussed the limitations of evaluation and management by telemedicine. The patient expressed understanding and agreed to proceed.  Review of Systems     Cardiac Risk Factors include: advanced age (>56mn, >>11women);male gender;dyslipidemia;hypertension     Objective:    Today's Vitals   04/03/22 1233  Weight: 185 lb (83.9 kg)  Height: '5\' 9"'$  (1.753 m)   Body mass index is 27.32 kg/m.     04/03/2022   12:44 PM 03/06/2021    9:23 AM 03/03/2020   12:58 PM 11/07/2016   10:55 AM  Advanced Directives  Does Patient Have a Medical Advance Directive? Yes Yes Yes Yes  Type of Advance Directive Living will;Healthcare Power of Attorney Living will;Healthcare Power of [place] will Living will;Healthcare Power of Attorney  Does patient want to make changes to medical advance directive? No - Patient declined No - Patient declined No - Patient declined   Copy of [place] Chart? No - copy requested No - copy requested No - copy requested     Current Medications (verified) Outpatient Encounter Medications as of 04/03/2022  Medication Sig   ASTRAGALUS PO Take 1 tablet by mouth daily at 6 (six) AM.   LYSINE PO Take 1 tablet by mouth daily at 6 (six) AM.   Multiple Vitamin (MULTIVITAMIN PO) Take 1 tablet by mouth daily at 6 (six) AM.   propranolol (INDERAL) 40 MG tablet Take 1 tablet (40 mg total) by mouth 2 (two) times daily.   Psyllium (WAL-MUCIL PO) Take 1 tablet by mouth in the morning, at noon, in the evening, and at bedtime.   No facility-administered encounter medications on file  as of 04/03/2022.    Allergies (verified) Patient has no known allergies.   History: Past Medical History:  Diagnosis Date   Benign prostatic hypertrophy    [place]    skin,squamous cell..pmh of ([facility])   Cataract    bialteral-not a surgical candidate at this time (03/24/2020)   Gilbert's syndrome    Hyperlipidemia    NMR:LDL [id number] HDL 77,TG 61 - diet controlled   Hyperplastic colon polyp    pmh   Hypertension    on meds   Syncope    vaso vagal    Past Surgical History:  Procedure Laterality Date   COLONOSCOPY  05/2011    Dr BOlevia Perches  COLONOSCOPY  2018   SA-MAC-good prep-TICS/hems/HPP x4/multiple frags fo TA/tubulovillous adenomas   COLONOSCOPY W/ POLYPECTOMY  2008   VASECTOMY     Family History  Problem Relation Age of Onset   Alzheimer's disease Mother    Colon cancer Mother 564  Lung cancer Father        smoker   Alzheimer's disease Maternal Aunt        x2 aunts.x 2   Alzheimer's disease Maternal Uncle        x 2   Heart disease Paternal Uncle        CABG,CAD   Diabetes Paternal Uncle    Hypertension Maternal Grandmother    Hypertension Maternal Grandfather    Stroke Maternal Grandfather [phone]  Hypertension Paternal Grandmother  Stroke Paternal Grandmother        in 70s   Hypertension Paternal Grandfather    Stroke Paternal Grandfather        late 12s   Schizophrenia Other        aunt   Diabetes Paternal Aunt    Stomach cancer Neg Hx    Heart attack Neg Hx    Colon polyps Neg Hx    Rectal cancer Neg Hx    Social History   Socioeconomic History   Marital status: Married    Spouse name: Not on file   Number of children: Not on file   Years of education: Not on file   Highest education level: Not on file  Occupational History   Occupation: Teacher, adult education Choice    Employer: [employer]  Tobacco Use   Smoking status: Never   Smokeless tobacco: Never  Vaping Use   Vaping Use: Never used  Substance and Sexual  Activity   Alcohol use: Not Currently   Drug use: No   Sexual activity: Not on file  Other Topics Concern   Not on file  Social History Narrative   Regular exercise   Social Determinants of Health   Financial Resource Strain: Low Risk  (04/03/2022)   Overall Financial Resource Strain (CARDIA)    Difficulty of Paying Living Expenses: Not hard at all  Food Insecurity: No Food Insecurity (04/03/2022)   Hunger Vital Sign    Worried About Running Out of Food in the Last Year: Never true    [place] in the Last Year: Never true  Transportation Needs: No Transportation Needs (04/03/2022)   PRAPARE - Hydrologist (Medical): No    Lack of Transportation (Non-Medical): No  Physical Activity: Sufficiently Active (04/03/2022)   Exercise Vital Sign    Days of Exercise per Week: 7 days    Minutes of Exercise per Session: 40 min  Stress: No Stress Concern Present (04/03/2022)   [place]    Feeling of Stress : Not at all  Social Connections: [place] (04/03/2022)   Social Connection and Isolation Panel [NHANES]    Frequency of Communication with Friends and Family: More than three times a week    Frequency of Social Gatherings with Friends and Family: Twice a week    Attends Religious Services: 1 to 4 times per year    Active Member of [employer] or Organizations: Yes    Attends Archivist Meetings: 1 to 4 times per year    Marital Status: Married    Tobacco Counseling Counseling given: Not Answered   Clinical Intake:  Pre-visit preparation completed: Yes  Pain : No/denies pain  Diabetes: No  How often do you need to have someone help you when you read instructions, pamphlets, or other written materials from your doctor or pharmacy?: 1 - Never  Diabetic?No   Interpreter Needed?: No  Information entered by :: Denman George LPN   Activities of Daily Living     04/03/2022   12:44 PM 04/01/2022    7:47 AM  In your present state of health, do you have any difficulty performing the following activities:  Hearing? 0 0  Vision? 0 1  Difficulty concentrating or making decisions? 0 0  Walking or climbing stairs? 0 0  Dressing or bathing? 0 0  Doing errands, shopping? 0 0  Preparing Food and eating ? N N  Using the Toilet? N N  In the past six months, have you accidently leaked urine? N N  Do you have problems with loss of bowel control? N N  Managing your Medications? N N  Managing your Finances? N N  Housekeeping or managing your Housekeeping? N N    Patient Care Team: Binnie Rail, MD as PCP - General (Internal Medicine) [place], Carlota Raspberry, MD as Consulting Physician (Gastroenterology) [place], [place] any recent Medical Services you may have received from other than [facility] providers in the past year (date may be approximate).     Assessment:   This is a routine wellness examination for Genie.  Hearing/Vision screen Hearing Screening - Comments:: Denies hearing difficulties  Vision Screening - Comments:: Wears rx glasses - up to date with routine eye exams with [facility] [place] location    Dietary issues and exercise activities discussed: Current Exercise Habits: Home exercise routine, Type of exercise: walking;strength training/weights, Time (Minutes): 40, Frequency (Times/Week): 5, Weekly Exercise (Minutes/Week): 200, Intensity: Mild   Goals Addressed               This Visit's Progress     COMPLETED: Patient Stated (pt-stated)        My goal is to lose 15 pounds this year.      Remain active and independent        Depression Screen    04/03/2022   12:41 PM 02/23/2022   10:33 AM 03/06/2021    9:27 AM 03/03/2020   12:56 PM 05/19/2019    8:03 AM 02/18/2018    9:54 AM 09/18/2016   11:52 AM  PHQ 2/9 Scores  PHQ - 2 Score 0 0 0 0 0 0 0  PHQ- 9 Score  0         Fall Risk     04/03/2022   12:34 PM 04/01/2022    7:47 AM 02/23/2022   10:33 AM 03/06/2021    9:24 AM 03/03/2020   12:57 PM  Fall Risk   Falls in the past year? 0 0 0 0 0  Number falls in past yr: 0 0 0 0 0  Injury with Fall? 0 0 0 0 0  Risk for fall due to : No Fall Risks  No Fall Risks No Fall Risks No Fall Risks  Follow up Falls prevention discussed;Education provided;Falls evaluation completed  Falls evaluation completed Falls evaluation completed     FALL RISK PREVENTION PERTAINING TO THE HOME:  Any stairs in or around the home? Yes  If so, are there any without handrails? No  Home free of loose throw rugs in walkways, pet beds, electrical cords, etc? Yes  Adequate lighting in your home to reduce risk of falls? Yes   ASSISTIVE DEVICES UTILIZED TO PREVENT FALLS:  Life alert? No  Use of a cane, walker or w/c? No  Grab bars in the bathroom? Yes  Shower chair or bench in shower? No  Elevated toilet seat or a handicapped toilet? Yes   TIMED UP AND GO:  Was the test performed? No . Telephonic visit   Cognitive Function:        04/03/2022   12:44 PM  6CIT Screen  What Year? 0 points  What month? 0 points  What time? 0 points  Count back from 20 0 points  Months in reverse 0 points  Repeat phrase 0 points  Total Score 0 points    Immunizations Immunization History  Administered Date(s) Administered   Fluad Quad(high Dose 65+) 11/08/2018   Influenza, High Dose Seasonal PF 02/18/2018   Influenza,inj,Quad PF,6+ Mos 11/18/2013   Influenza-Unspecified 10/24/2016, 12/02/2019, 11/29/2020, 10/04/2021   PFIZER(Purple Top)SARS-COV-2 Vaccination 03/07/2019, 03/30/2019, 12/02/2019   Pneumococcal Conjugate-13 05/19/2019   Pneumococcal Polysaccharide-23 03/03/2020   RSV,unspecified 01/04/2022   Td 01/31/2010   Tdap 06/06/2021   Zoster Recombinat (Shingrix) 06/06/2021, 08/14/2021   Zoster, Live 12/22/2013    TDAP status: Up to date  Flu Vaccine status: Up to date  Pneumococcal vaccine  status: Up to date  Covid-19 vaccine status: Information provided on how to obtain vaccines.   Qualifies for Shingles Vaccine? Yes   Zostavax completed Yes   Shingrix Completed?: Yes  Screening Tests Health Maintenance  Topic Date Due   COVID-19 Vaccine (4 - 2023-24 season) 09/22/2021   Medicare Annual Wellness (AWV)  04/03/2023   COLONOSCOPY (Pts 45-92yr Insurance coverage will need to be confirmed)  04/08/2025   DTaP/Tdap/Td (3 - Td or Tdap) 06/07/2031   Pneumonia Vaccine 71 Years old  Completed   INFLUENZA VACCINE  Completed   Hepatitis C Screening  Completed   Zoster Vaccines- Shingrix  Completed   HPV VACCINES  Aged Out    Health Maintenance  Health Maintenance Due  Topic Date Due   COVID-19 Vaccine (4 - 2023-24 season) 09/22/2021    Colorectal cancer screening: Type of screening: Colonoscopy. Completed 04/08/20. Repeat every 5 years  Lung Cancer Screening: (Low Dose CT Chest recommended if Age 71-80years, 30 pack-year currently smoking OR have quit w/in 15years.) does not qualify.   Lung Cancer Screening Referral: n/a  Additional Screening:  Hepatitis C Screening: does qualify; Completed 05/31/15  Vision Screening: Recommended annual ophthalmology exams for early detection of glaucoma and other disorders of the eye. Is the patient up to date with their annual eye exam?  Yes  Who is the provider or what is the name of the office in which the patient attends annual eye exams[facility] If pt is not established with a provider, would they like to be referred to a provider to establish care? No .   Dental Screening: Recommended annual dental exams for proper oral hygiene  Community Resource Referral / Chronic Care Management: CRR required this visit?  No   CCM required this visit?  No      Plan:     I have personally reviewed and noted the following in the patient's chart:   Medical and social history Use of alcohol, tobacco or illicit drugs   Current medications and supplements including opioid prescriptions. Patient is not currently taking opioid prescriptions. Functional ability and status Nutritional status Physical activity Advanced directives List of other physicians Hospitalizations, surgeries, and ER visits in previous 12 months Vitals Screenings to include cognitive, depression, and falls Referrals and appointments  In addition, I have reviewed and discussed with patient certain preventive protocols, quality metrics, and best practice recommendations. A written personalized care plan for preventive services as well as general preventive health recommendations were provided to patient.     SVanetta Mulders [place]  [id number]  Due to this being a virtual visit, the after visit summary with patients personalized plan was offered to patient via mail or my-chart.  Patient would like to access on my-chart  Nurse Notes: No concerns

## 2022-05-16 ENCOUNTER — Telehealth: Payer: Medicare Other | Admitting: Nurse Practitioner

## 2022-05-16 DIAGNOSIS — H1030 Unspecified acute conjunctivitis, unspecified eye: Secondary | ICD-10-CM

## 2022-05-16 MED ORDER — OFLOXACIN 0.3 % OP SOLN: 1.0000 [drp] | Freq: Four times a day (QID) | OPHTHALMIC | 0 refills | Status: AC

## 2022-05-16 NOTE — Progress Notes (Signed)
E-Visit for Pink Eye   We are sorry that you are not feeling well.  Here is how we plan to help!  Based on what you have shared with me it looks like you have conjunctivitis.  Conjunctivitis is a common inflammatory or infectious condition of the eye that is often referred to as "pink eye".  In most cases it is contagious (viral or bacterial). However, not all conjunctivitis requires antibiotics (ex. Allergic).  We have made appropriate suggestions for you based upon your presentation.  I have prescribed Oflaxacin 1-2 drops 4 times a day times 5 days   Pink eye can be highly contagious.  It is typically spread through direct contact with secretions, or contaminated objects or surfaces that one may have touched.  Strict handwashing is suggested with soap and water is urged.  If not available, use alcohol based had sanitizer.  Avoid unnecessary touching of the eye.  If you wear contact lenses, you will need to refrain from wearing them until you see no white discharge from the eye for at least 24 hours after being on medication.  You should see symptom improvement in 1-2 days after starting the medication regimen.  Call us if symptoms are not improved in 1-2 days.  Home Care: Wash your hands often! Do not wear your contacts until you complete your treatment plan. Avoid sharing towels, bed linen, personal items with a person who has pink eye. See attention for anyone in your home with similar symptoms.  Get Help Right Away If: Your symptoms do not improve. You develop blurred or loss of vision. Your symptoms worsen (increased discharge, pain or redness)   Thank you for choosing an e-visit.  Your e-visit answers were reviewed by a board certified advanced clinical practitioner to complete your personal care plan. Depending upon the condition, your plan could have included both over the counter or prescription medications.  Please review your pharmacy choice. Make sure the pharmacy is open so  you can pick up prescription now. If there is a problem, you may contact your provider through MyChart messaging and have the prescription routed to another pharmacy.  Your safety is important to us. If you have drug allergies check your prescription carefully.   For the next 24 hours you can use MyChart to ask questions about today's visit, request a non-urgent call back, or ask for a work or school excuse. You will get an email in the next two days asking about your experience. I hope that your e-visit has been valuable and will speed your recovery.  Meds ordered this encounter  Medications   ofloxacin (OCUFLOX) 0.3 % ophthalmic solution    Sig: Place 1 drop into the left eye 4 (four) times daily for 5 days.    Dispense:  5 mL    Refill:  0     I spent approximately 5 minutes reviewing the patient's history, current symptoms and coordinating their care today.   

## 2022-06-21 ENCOUNTER — Encounter: Payer: Self-pay | Admitting: Internal Medicine

## 2022-07-03 ENCOUNTER — Encounter: Payer: Self-pay | Admitting: Internal Medicine

## 2022-07-03 NOTE — Patient Instructions (Addendum)
      Blood work was ordered.  Have this done in a week or two.    Medications changes include :   valsartan 40 mg daily, malarone for your trip   Monitor your BP at home

## 2022-07-03 NOTE — Progress Notes (Unsigned)
    Subjective:    Patient ID: Ricky Tate, male    DOB: 20-Feb-1950, 72 y.o.   MRN: [id number]      HPI [facility] is here for No chief complaint on file.   BP elevated-    travelling to [place], [place], [place] and [place] beginning on July 26 and returning on August 13  - needs malarone   Medications and allergies reviewed with patient and updated if appropriate.  Current Outpatient Medications on File Prior to Visit  Medication Sig Dispense Refill   ASTRAGALUS PO Take 1 tablet by mouth daily at 6 (six) AM.     LYSINE PO Take 1 tablet by mouth daily at 6 (six) AM.     Multiple Vitamin (MULTIVITAMIN PO) Take 1 tablet by mouth daily at 6 (six) AM.     propranolol (INDERAL) 40 MG tablet Take 1 tablet (40 mg total) by mouth 2 (two) times daily. 180 tablet 3   Psyllium (WAL-MUCIL PO) Take 1 tablet by mouth in the morning, at noon, in the evening, and at bedtime.     No current facility-administered medications on file prior to visit.    Review of Systems     Objective:  There were no vitals filed for this visit. BP Readings from Last 3 Encounters:  03/14/22 (!) 190/100  02/23/22 120/78  02/22/21 (!) 142/88   Wt Readings from Last 3 Encounters:  04/03/22 185 lb (83.9 kg)  03/14/22 185 lb (83.9 kg)  02/23/22 185 lb (83.9 kg)   There is no height or weight on file to calculate BMI.    Physical Exam         Assessment & Plan:    See Problem List for Assessment and Plan of chronic medical problems.

## 2022-07-04 ENCOUNTER — Ambulatory Visit (INDEPENDENT_AMBULATORY_CARE_PROVIDER_SITE_OTHER): Payer: Medicare Other | Admitting: Internal Medicine

## 2022-07-04 VITALS — BP 136/82 | HR 63 | Temp 98.3°F | Ht 69.0 in | Wt 189.0 lb

## 2022-07-04 DIAGNOSIS — I10 Essential (primary) hypertension: Secondary | ICD-10-CM

## 2022-07-04 MED ORDER — ATOVAQUONE-PROGUANIL HCL 250-100 MG PO TABS: 1.0000 | ORAL_TABLET | Freq: Every day | ORAL | 0 refills | Status: AC

## 2022-07-04 MED ORDER — VALSARTAN 40 MG PO TABS: 40.0000 mg | ORAL_TABLET | Freq: Every day | ORAL | 3 refills | Status: DC

## 2022-07-04 NOTE — Assessment & Plan Note (Signed)
Chronic Blood pressure not well controlled Continue propranolol to 40 mg twice daily Start valsartan 40 mg daily

## 2022-07-18 ENCOUNTER — Other Ambulatory Visit: Payer: Medicare Other

## 2022-07-18 DIAGNOSIS — I10 Essential (primary) hypertension: Secondary | ICD-10-CM

## 2022-07-18 LAB — BASIC METABOLIC PANEL
BUN: 15 mg/dL (ref 6–23)
CO2: 27 mEq/L (ref 19–32)
Calcium: 9.9 mg/dL (ref 8.4–10.5)
Chloride: 100 mEq/L (ref 96–112)
Creatinine, Ser: 0.83 mg/dL (ref 0.40–1.50)
GFR: 88.46 mL/min (ref >60.00)
Glucose, Bld: 84 mg/dL (ref 70–99)
Potassium: 4.3 mEq/L (ref 3.5–5.1)
Sodium: 134 mEq/L — ABNORMAL LOW (ref 135–145)

## 2022-09-06 ENCOUNTER — Encounter: Payer: Self-pay | Admitting: Internal Medicine

## 2022-09-06 ENCOUNTER — Other Ambulatory Visit: Payer: Self-pay

## 2022-09-06 MED ORDER — PROPRANOLOL HCL 40 MG PO TABS: 40.0000 mg | ORAL_TABLET | Freq: Two times a day (BID) | ORAL | 3 refills | Status: DC

## 2022-10-01 ENCOUNTER — Other Ambulatory Visit: Payer: Self-pay

## 2022-10-01 MED ORDER — VALSARTAN 40 MG PO TABS: 40.0000 mg | ORAL_TABLET | Freq: Every day | ORAL | 3 refills | Status: AC

## 2023-01-24 ENCOUNTER — Telehealth: Payer: Medicare Other | Admitting: Physician Assistant

## 2023-01-24 DIAGNOSIS — J208 Acute bronchitis due to other specified organisms: Secondary | ICD-10-CM | POA: Diagnosis not present

## 2023-01-24 DIAGNOSIS — B9689 Other specified bacterial agents as the cause of diseases classified elsewhere: Secondary | ICD-10-CM | POA: Diagnosis not present

## 2023-01-24 MED ORDER — AZITHROMYCIN 250 MG PO TABS
ORAL_TABLET | ORAL | 0 refills | Status: AC
Start: 2023-01-24 — End: 2023-01-29

## 2023-01-24 MED ORDER — BENZONATATE 100 MG PO CAPS
100.0000 mg | ORAL_CAPSULE | Freq: Three times a day (TID) | ORAL | 0 refills | Status: AC | PRN
Start: 2023-01-24 — End: ?

## 2023-01-24 NOTE — Progress Notes (Signed)

## 2023-01-30 ENCOUNTER — Other Ambulatory Visit: Payer: Self-pay | Admitting: Surgery

## 2023-01-30 DIAGNOSIS — I77810 Thoracic aortic ectasia: Secondary | ICD-10-CM

## 2023-03-21 ENCOUNTER — Other Ambulatory Visit: Payer: Medicare Other

## 2023-03-21 ENCOUNTER — Ambulatory Visit: Payer: Medicare Other

## 2023-04-04 ENCOUNTER — Ambulatory Visit: Payer: Medicare Other

## 2023-04-30 ENCOUNTER — Telehealth: Payer: Self-pay | Admitting: Internal Medicine

## 2023-04-30 NOTE — Telephone Encounter (Signed)
 Contacted Ricky Tate to schedule their annual wellness visit. Patient declined to schedule AWV at this time. Patient has moved to [place].  [facility] Care Guide [facility] AWV TEAM Direct Dial: [phone]

## 2023-08-29 ENCOUNTER — Other Ambulatory Visit: Payer: Self-pay | Admitting: Internal Medicine

## 2023-09-20 ENCOUNTER — Other Ambulatory Visit: Payer: Self-pay | Admitting: Internal Medicine

## 2023-09-21 ENCOUNTER — Other Ambulatory Visit: Payer: Self-pay | Admitting: Internal Medicine

## 2023-09-25 ENCOUNTER — Other Ambulatory Visit: Payer: Self-pay | Admitting: Internal Medicine
